# Patient Record
Sex: Female | Born: 1965 | Race: White | Hispanic: No | Marital: Married | Smoking: Never smoker
Health system: Southern US, Community
[De-identification: ages and names within clinical notes are randomized; demographics above are authoritative.]

## PROBLEM LIST (undated history)

## (undated) DIAGNOSIS — K219 Gastro-esophageal reflux disease without esophagitis: Secondary | ICD-10-CM

## (undated) DIAGNOSIS — R002 Palpitations: Secondary | ICD-10-CM

## (undated) DIAGNOSIS — N393 Stress incontinence (female) (male): Secondary | ICD-10-CM

## (undated) DIAGNOSIS — D649 Anemia, unspecified: Secondary | ICD-10-CM

## (undated) DIAGNOSIS — T7840XA Allergy, unspecified, initial encounter: Secondary | ICD-10-CM

## (undated) DIAGNOSIS — R112 Nausea with vomiting, unspecified: Secondary | ICD-10-CM

## (undated) DIAGNOSIS — R519 Headache, unspecified: Secondary | ICD-10-CM

## (undated) DIAGNOSIS — Z9889 Other specified postprocedural states: Secondary | ICD-10-CM

## (undated) DIAGNOSIS — R031 Nonspecific low blood-pressure reading: Secondary | ICD-10-CM

## (undated) DIAGNOSIS — T4145XA Adverse effect of unspecified anesthetic, initial encounter: Secondary | ICD-10-CM

## (undated) DIAGNOSIS — T8859XA Other complications of anesthesia, initial encounter: Secondary | ICD-10-CM

## (undated) DIAGNOSIS — R51 Headache: Secondary | ICD-10-CM

## (undated) HISTORY — PX: ABDOMINAL HYSTERECTOMY: SHX81

## (undated) HISTORY — DX: Stress incontinence (female) (male): N39.3

## (undated) HISTORY — PX: TONSILLECTOMY: SUR1361

## (undated) HISTORY — PX: PLANTAR FASCIA SURGERY: SHX746

## (undated) HISTORY — DX: Allergy, unspecified, initial encounter: T78.40XA

## (undated) HISTORY — PX: BREAST EXCISIONAL BIOPSY: SUR124

---

## 2000-05-08 ENCOUNTER — Encounter: Payer: Self-pay | Admitting: Emergency Medicine

## 2000-05-08 ENCOUNTER — Emergency Department (HOSPITAL_COMMUNITY): Admission: EM | Admit: 2000-05-08 | Discharge: 2000-05-08 | Payer: Self-pay | Admitting: Emergency Medicine

## 2000-05-12 HISTORY — PX: MRI: SHX5353

## 2005-02-03 ENCOUNTER — Ambulatory Visit: Payer: Self-pay | Admitting: Family Medicine

## 2005-02-08 ENCOUNTER — Ambulatory Visit: Payer: Self-pay | Admitting: Family Medicine

## 2005-02-09 ENCOUNTER — Encounter: Admission: RE | Admit: 2005-02-09 | Discharge: 2005-02-09 | Payer: Self-pay | Admitting: Family Medicine

## 2006-01-18 ENCOUNTER — Ambulatory Visit: Payer: Self-pay | Admitting: Family Medicine

## 2007-02-07 ENCOUNTER — Ambulatory Visit: Payer: Self-pay | Admitting: Family Medicine

## 2007-02-07 LAB — CONVERTED CEMR LAB
Basophils Absolute: 0 10*3/uL (ref 0.0–0.1)
Basophils Relative: 0 % (ref 0.0–1.0)
Eosinophils Absolute: 0.1 10*3/uL (ref 0.0–0.6)
Eosinophils Relative: 0.7 % (ref 0.0–5.0)
HCT: 38.9 % (ref 36.0–46.0)
Hemoglobin: 13.5 g/dL (ref 12.0–15.0)
Lymphocytes Relative: 4.8 % — ABNORMAL LOW (ref 12.0–46.0)
MCHC: 34.8 g/dL (ref 30.0–36.0)
MCV: 97.9 fL (ref 78.0–100.0)
Monocytes Absolute: 0.4 10*3/uL (ref 0.2–0.7)
Monocytes Relative: 4.8 % (ref 3.0–11.0)
Neutro Abs: 8.2 10*3/uL — ABNORMAL HIGH (ref 1.4–7.7)
Neutrophils Relative %: 89.7 % — ABNORMAL HIGH (ref 43.0–77.0)
Platelets: 305 10*3/uL (ref 150–400)
RBC: 3.97 M/uL (ref 3.87–5.11)
RDW: 12.9 % (ref 11.5–14.6)
WBC: 9.1 10*3/uL (ref 4.5–10.5)

## 2007-08-16 DIAGNOSIS — R002 Palpitations: Secondary | ICD-10-CM | POA: Insufficient documentation

## 2007-08-18 ENCOUNTER — Ambulatory Visit: Payer: Self-pay | Admitting: Family Medicine

## 2007-08-18 LAB — CONVERTED CEMR LAB
Bacteria, UA: 0
Beta hcg, urine, semiquantitative: NEGATIVE
Bilirubin Urine: NEGATIVE
Blood in Urine, dipstick: NEGATIVE
Casts: 0 /lpf
Epithelial cells, urine: 1 /lpf
Glucose, Urine, Semiquant: NEGATIVE
Ketones, urine, test strip: NEGATIVE
Nitrite: NEGATIVE
Protein, U semiquant: NEGATIVE
RBC / HPF: 0
Specific Gravity, Urine: 1.015
Urine crystals, microscopic: 0 /hpf
Urobilinogen, UA: 0.2
WBC Urine, dipstick: NEGATIVE
WBC, UA: 0 cells/hpf
Yeast, UA: 0
pH: 6

## 2007-08-20 ENCOUNTER — Encounter: Payer: Self-pay | Admitting: Family Medicine

## 2007-08-20 ENCOUNTER — Emergency Department: Payer: Self-pay | Admitting: Emergency Medicine

## 2007-08-21 ENCOUNTER — Telehealth: Payer: Self-pay | Admitting: Family Medicine

## 2007-08-23 ENCOUNTER — Encounter: Payer: Self-pay | Admitting: Family Medicine

## 2007-08-29 ENCOUNTER — Telehealth: Payer: Self-pay | Admitting: Family Medicine

## 2007-09-05 ENCOUNTER — Ambulatory Visit: Payer: Self-pay | Admitting: Family Medicine

## 2007-09-05 ENCOUNTER — Encounter: Payer: Self-pay | Admitting: Family Medicine

## 2007-09-12 ENCOUNTER — Encounter (INDEPENDENT_AMBULATORY_CARE_PROVIDER_SITE_OTHER): Payer: Self-pay | Admitting: *Deleted

## 2007-12-15 ENCOUNTER — Ambulatory Visit: Payer: Self-pay | Admitting: Family Medicine

## 2007-12-15 DIAGNOSIS — J019 Acute sinusitis, unspecified: Secondary | ICD-10-CM

## 2008-01-08 ENCOUNTER — Telehealth: Payer: Self-pay | Admitting: Family Medicine

## 2008-06-27 ENCOUNTER — Inpatient Hospital Stay: Payer: Self-pay | Admitting: Unknown Physician Specialty

## 2008-09-25 ENCOUNTER — Telehealth: Payer: Self-pay | Admitting: Family Medicine

## 2008-10-07 ENCOUNTER — Ambulatory Visit: Payer: Self-pay | Admitting: Family Medicine

## 2008-10-10 ENCOUNTER — Telehealth: Payer: Self-pay | Admitting: Family Medicine

## 2008-10-15 ENCOUNTER — Telehealth: Payer: Self-pay | Admitting: Family Medicine

## 2008-11-14 ENCOUNTER — Encounter: Payer: Self-pay | Admitting: Family Medicine

## 2008-11-14 ENCOUNTER — Ambulatory Visit: Payer: Self-pay | Admitting: Family Medicine

## 2008-11-19 ENCOUNTER — Encounter (INDEPENDENT_AMBULATORY_CARE_PROVIDER_SITE_OTHER): Payer: Self-pay | Admitting: *Deleted

## 2009-02-04 ENCOUNTER — Ambulatory Visit: Payer: Self-pay | Admitting: Family Medicine

## 2009-02-04 DIAGNOSIS — R5381 Other malaise: Secondary | ICD-10-CM

## 2009-02-04 DIAGNOSIS — R5383 Other fatigue: Secondary | ICD-10-CM | POA: Insufficient documentation

## 2009-02-05 LAB — CONVERTED CEMR LAB
BUN: 12 mg/dL (ref 6–23)
Basophils Absolute: 0 10*3/uL (ref 0.0–0.1)
Basophils Relative: 0.1 % (ref 0.0–3.0)
CO2: 32 meq/L (ref 19–32)
Calcium: 9.2 mg/dL (ref 8.4–10.5)
Chloride: 103 meq/L (ref 96–112)
Creatinine, Ser: 0.6 mg/dL (ref 0.4–1.2)
Eosinophils Absolute: 0.4 10*3/uL (ref 0.0–0.7)
Eosinophils Relative: 6 % — ABNORMAL HIGH (ref 0.0–5.0)
Folate: 12.6 ng/mL
GFR calc Af Amer: 141 mL/min
GFR calc non Af Amer: 117 mL/min
Glucose, Bld: 77 mg/dL (ref 70–99)
HCT: 37.6 % (ref 36.0–46.0)
Hemoglobin: 12.8 g/dL (ref 12.0–15.0)
Lymphocytes Relative: 21.7 % (ref 12.0–46.0)
MCHC: 34.1 g/dL (ref 30.0–36.0)
MCV: 99.9 fL (ref 78.0–100.0)
Monocytes Absolute: 0.6 10*3/uL (ref 0.1–1.0)
Monocytes Relative: 8.2 % (ref 3.0–12.0)
Neutro Abs: 4.9 10*3/uL (ref 1.4–7.7)
Neutrophils Relative %: 64 % (ref 43.0–77.0)
Platelets: 265 10*3/uL (ref 150–400)
Potassium: 4.2 meq/L (ref 3.5–5.1)
RBC: 3.77 M/uL — ABNORMAL LOW (ref 3.87–5.11)
RDW: 13 % (ref 11.5–14.6)
Sodium: 141 meq/L (ref 135–145)
TSH: 0.56 microintl units/mL (ref 0.35–5.50)
Vitamin B-12: 557 pg/mL (ref 211–911)
WBC: 7.5 10*3/uL (ref 4.5–10.5)

## 2009-02-06 ENCOUNTER — Ambulatory Visit: Payer: Self-pay | Admitting: Cardiovascular Disease

## 2009-02-14 ENCOUNTER — Telehealth: Payer: Self-pay | Admitting: Family Medicine

## 2009-08-13 ENCOUNTER — Ambulatory Visit: Payer: Self-pay | Admitting: Family Medicine

## 2009-08-14 ENCOUNTER — Telehealth: Payer: Self-pay | Admitting: Family Medicine

## 2009-08-21 ENCOUNTER — Telehealth: Payer: Self-pay | Admitting: Family Medicine

## 2009-09-02 ENCOUNTER — Telehealth: Payer: Self-pay | Admitting: Family Medicine

## 2009-11-24 ENCOUNTER — Ambulatory Visit: Payer: Self-pay | Admitting: Family Medicine

## 2009-11-24 ENCOUNTER — Encounter: Payer: Self-pay | Admitting: Family Medicine

## 2009-12-01 ENCOUNTER — Encounter (INDEPENDENT_AMBULATORY_CARE_PROVIDER_SITE_OTHER): Payer: Self-pay | Admitting: *Deleted

## 2009-12-18 ENCOUNTER — Ambulatory Visit: Payer: Self-pay | Admitting: Family Medicine

## 2009-12-18 DIAGNOSIS — M542 Cervicalgia: Secondary | ICD-10-CM

## 2009-12-18 DIAGNOSIS — R110 Nausea: Secondary | ICD-10-CM

## 2009-12-18 DIAGNOSIS — S0990XA Unspecified injury of head, initial encounter: Secondary | ICD-10-CM | POA: Insufficient documentation

## 2009-12-18 DIAGNOSIS — G4452 New daily persistent headache (NDPH): Secondary | ICD-10-CM

## 2009-12-22 ENCOUNTER — Telehealth: Payer: Self-pay | Admitting: Family Medicine

## 2009-12-23 ENCOUNTER — Telehealth: Payer: Self-pay | Admitting: Family Medicine

## 2010-07-02 ENCOUNTER — Emergency Department: Payer: Self-pay | Admitting: Emergency Medicine

## 2010-07-11 ENCOUNTER — Ambulatory Visit: Payer: Self-pay | Admitting: Orthopedic Surgery

## 2010-11-17 ENCOUNTER — Ambulatory Visit: Payer: Self-pay | Admitting: Family Medicine

## 2010-12-23 ENCOUNTER — Telehealth: Payer: Self-pay | Admitting: Family Medicine

## 2010-12-27 ENCOUNTER — Encounter: Payer: Self-pay | Admitting: Family Medicine

## 2011-01-05 NOTE — Assessment & Plan Note (Signed)
Summary: FELL HIT FOREHEAD/PAIN IN BACK OF NECK/RBH   Vital Signs:  Patient profile:   45 year old female Weight:      224 pounds Temp:     98.1 degrees F oral Pulse rate:   72 / minute Pulse rhythm:   regular BP sitting:   94 / 60  (left arm) Cuff size:   regular  Vitals Entered By: Lowella Petties CMA (December 18, 2009 1:19 PM)  CC: Larey Seat this morning, has pain left face and neck area.   History of Present Illness: this am - lost balance bending over - fell and hit her head on the floor  hit right in the middle of her forehead -- did no loose consciousness  felt weird for a while  her neck was sore right afterwards   had some nausea this am -- that is improved now (this is common anyway however)  has some headache - and neck hurts  headache is on L side of face and head  bump on her head is sore  no vision change  felt a little dizzy initially - but not now   was a little numb and tingly in L shoulder briefly- and this is resolved   Allergies: 1)  ! * Robitussin Ac 2)  * Toprol 3)  Ibuprofen  Past History:  Past Medical History: Last updated: 02/04/2009 allergies  endometriosis stress incontinence   Past Surgical History: Last updated: 02/04/2009 Holter monitor (12/11/1999) MRI brain- neg  (05/12/2000) hysterectomy total (7/09)- for endometriosis  Family History: Last updated: 08/16/2007 Father:  Mother: PE from DVT in leg Siblings: had 2 sisters, 1 died from breast cancer. 1 brother  Social History: Last updated: 08/16/2007 Marital Status: Married Children: 2 Occupation: Geneticist, molecular system  Risk Factors: Smoking Status: never (08/16/2007)  Review of Systems General:  Denies fatigue, loss of appetite, and malaise. Eyes:  Denies blurring, double vision, eye pain, and light sensitivity. ENT:  Denies earache. CV:  Denies chest pain or discomfort and palpitations. Resp:  Denies cough and shortness of breath. GI:  Complains of nausea; denies  vomiting. MS:  Complains of mid back pain and stiffness; denies joint pain. Derm:  Denies rash. Neuro:  Complains of headaches; denies inability to speak, memory loss, numbness, sensation of room spinning, tingling, visual disturbances, and weakness. Psych:  Denies anxiety and depression. Heme:  Denies abnormal bruising and bleeding.  Physical Exam  General:  overweight but generally well appearing   Head:  normocephalic.  bump with hematoma that is tender noted in middle of forehead  Eyes:  vision grossly intact, pupils equal, pupils round, and pupils reactive to light.  fundi grossly wnl Ears:  R ear normal and L ear normal.  no hemotympanum Nose:  no nasal discharge.   Mouth:  pharynx pink and moist.   Neck:  tender lower CS some pain to fully flex/ with nl ext and rotation   Chest Wall:  No deformities, masses, or tenderness noted. Lungs:  Normal respiratory effort, chest expands symmetrically. Lungs are clear to auscultation, no crackles or wheezes. Heart:  Normal rate and regular rhythm. S1 and S2 normal without gallop, murmur, click, rub or other extra sounds. Msk:  No deformity or scoliosis noted of thoracic or lumbar spine.   nl rom UEs bilat  no shoulder or trap tenderness noted Extremities:  No clubbing, cyanosis, edema, or deformity noted with normal full range of motion of all joints.   Neurologic:  alert & oriented X3,  cranial nerves II-XII intact, strength normal in all extremities, sensation intact to light touch, gait normal, DTRs symmetrical and normal, finger-to-nose normal, toes down bilaterally on Babinski, and Romberg negative.   Skin:  Intact without suspicious lesions or rashes Cervical Nodes:  No lymphadenopathy noted Psych:  normal affect, talkative and pleasant -- is a little anxious today   Impression & Recommendations:  Problem # 1:  HEAD TRAUMA, CLOSED (ICD-959.01) Assessment New with headache and nausea now  ref for Ct of head no contrast adv pt to  call asap if worse ha or other neurol sympotms  recommended just tylenol for headache for now  Orders: Radiology Referral (Radiology)  Problem # 2:  NECK PAIN (ICD-723.1) after head injury/ neck compression this am - without numbness/ weakness or UE symptoms  some lower CS tenderness  sent for CS film to update  adv is ok to use ice and heat for now  Orders: Radiology Referral (Radiology)  Complete Medication List: 1)  Womens Multivitamin Plus Tabs (Multiple vitamins-minerals) .... Take 1 tablet by mouth once a day 2)  Flonase 50 Mcg/act Susp (Fluticasone propionate) .... 2 sprays in each nostril once daily 3)  Proair Hfa 108 (90 Base) Mcg/act Aers (Albuterol sulfate) .... 2 puffs up to every 4 hours as needed wheezing 4)  Loratadine 10 Mg Tabs (Loratadine) .... Take one by mouth daily  Other Orders: Flu Vaccine 38yrs + [phone]) Admin 1st Vaccine (95621) Admin 1st Vaccine [facility]) [phone])  Patient Instructions: 1)  avoid aspirin or ibuprofen or aleve  2)  tylenol as needed for headache  3)  update me asap if headache or neck pain worsen or if any dizziness/ vomiting or vision change  4)  we will refer you for x ray of neck and CT of head at check out  5)  I will update you when I get a result   Prior Medications (reviewed today): WOMENS MULTIVITAMIN PLUS  TABS (MULTIPLE VITAMINS-MINERALS) Take 1 tablet by mouth once a day FLONASE 50 MCG/ACT SUSP (FLUTICASONE PROPIONATE) 2 sprays in each nostril once daily PROAIR HFA 108 (90 BASE) MCG/ACT AERS (ALBUTEROL SULFATE) 2 puffs up to every 4 hours as needed wheezing LORATADINE 10 MG TABS (LORATADINE) take one by mouth daily Current Allergies: ! * ROBITUSSIN AC * TOPROL IBUPROFEN   Influenza Vaccine    Vaccine Type: Fluvax 3+    Site: left deltoid    Mfr: GlaxoSmithKline    Dose: 0.5 ml    Route: IM    Given by: Lowella Petties CMA    Exp. Date: 06/04/2010    Lot #: [id number]    VIS given: 06/29/07 version given December 18, 2009.  Flu Vaccine Consent Questions    Do you have a history of severe allergic reactions to this vaccine? no    Any prior history of allergic reactions to egg and/or gelatin? no    Do you have a sensitivity to the preservative Thimersol? no    Do you have a past history of Guillan-Barre Syndrome? no    Do you currently have an acute febrile illness? no    Have you ever had a severe reaction to latex? no    Vaccine information given and explained to patient? yes    Are you currently pregnant? no

## 2011-01-05 NOTE — Progress Notes (Signed)
Summary: regarding CT  Phone Note Outgoing Call Call back at [facility] Phone [phone]   Summary of Call: Advised pt of xray results.  She is still having headaches and earaches.  She doesnt have a CT scheduled but would like to have this done at [facility].  Please advise. Initial call taken by: Lowella Petties CMA,  December 23, 2009 12:03 PM  Follow-up for Phone Call        I will go ahead and re - refer -- do not know why first one was canceled ? will route to [facility] Follow-up by: Judith Part MD,  December 23, 2009 12:23 PM  Additional Follow-up for Phone Call Additional follow up Details #1::        Pt said it was too far for her to drive and she wanted to get it done at [facility]. ok- thanks for the update  Additional Follow-up by: Lowella Petties CMA,  December 23, 2009 12:38 PM    Additional Follow-up for Phone Call Additional follow up Details #2::    REscheduled the CT Head at [facility] for 12/26/2009 at 8:00am. Order faxed to the registration desk at [facility].  Follow-up by: Carlton Adam,  December 23, 2009 3:14 PM   Appended Document: regarding CT Received a call from Carolyn/CT scanner at [facility] [phone], advising Dr. Milinda Antis that patient did not show for her CT scan today.    Appended Document: regarding CT please check in with her since she did not get CT- ask her how her symptoms are please   Appended Document: regarding CT [facility] asking pt to call.  Appended Document: regarding CT 3 messages have been left for pt to call back.

## 2011-01-05 NOTE — Progress Notes (Signed)
Summary: pt cancelled CT appt  Phone Note From Other Clinic   Caller: Receptionist Details for Reason: Cancelled CT Scan Summary of Call: Patient was scheduled to have a CT Scan today at [facility]. She called them today and cancelled the apt telling them she was having it set up somewhere else. It was authorized to be done with [place] she did not tell them where she was going. Initial call taken by: Carlton Adam,  December 22, 2009 2:46 PM  Follow-up for Phone Call        thanks- please check in with pt later and see how she is - ask where her CT was so I can send for it -- thanks  Follow-up by: Judith Part MD,  December 22, 2009 3:14 PM  Additional Follow-up for Phone Call Additional follow up Details #1::        [facility] for pt to call.               Lowella Petties CMA  December 23, 2009 8:42 AM  See other phone note.             Lowella Petties CMA  December 23, 2009 12:12 PM

## 2011-01-07 ENCOUNTER — Telehealth: Payer: Self-pay | Admitting: Family Medicine

## 2011-01-07 NOTE — Letter (Signed)
Summary: Out of Work  [facility] at [facility]  [address] [place], [place] [address]   Phone: [phone]  Fax: [phone]    November 17, 2010   Employee:  VERENA SHAWGO    To Whom It May Concern:   For Medical reasons, please excuse the above named employee from work/school from today until cough resolved.  Potentially contagious.   If you need additional information, please feel free to contact our office.         Sincerely,    Crawford Givens MD

## 2011-01-07 NOTE — Progress Notes (Signed)
Summary: sinus infection  Phone Note Call from Patient Call back at Home Phone [phone] Call back at Work Phone [phone]   Caller: Patient Call For: Briana Burnett Summary of Call: Patient was seen by Dr. Para March on 12-13 with a sinus infection. She took all of her antibiotic, but symptoms never went completely away. She says that she is having surgery on her foot friday and has already had to put it off because she was sick the first time. She says that she is still having cough, sputum, face feels tight. She is asking if she can get antibiotic called to [facility] [address]  Initial call taken by: Melody Comas,  December 23, 2010 9:39 AM  Follow-up for Phone Call        She needs recheck with an Burnett.  It's been over a month since last OV.  Follow-up by: Crawford Givens Burnett,  December 23, 2010 12:22 PM  Additional Follow-up for Phone Call Additional follow up Details #1::        Advised pt, she did not agree with decision.  I offered her appt for tomorrow but she said she couldnt come in until after 3:30.  I suggested she go to urgent care tonight to get started on medicine sooner.  She again asked if abx could be called in for her and I again told her it had been too long, she would need to be seen.  She thanked me and hung up the phone. Additional Follow-up by: Lowella Petties CMA, AAMA,  December 23, 2010 4:32 PM    Additional Follow-up for Phone Call Additional follow up Details #2::    noted. Crawford Givens Burnett  December 23, 2010 5:19 PM

## 2011-01-07 NOTE — Assessment & Plan Note (Signed)
Summary: NECK,BACK PAIN,FEVER/CLE   Vital Signs:  Patient profile:   45 year old female Height:      64.75 inches Weight:      224 pounds BMI:     37.70 Temp:     99.4 degrees F oral Pulse rate:   88 / minute Pulse rhythm:   regular BP sitting:   112 / 76  (left arm) Cuff size:   large  Vitals Entered By: Delilah Shan CMA Duncan Dull) (November 17, 2010 11:50 AM) CC: Neck, back pain, fever.   History of Present Illness: "Sinus symptoms going on for about 2 weeks."  Inc in aching and cold chills yesterday.  Plan for L plantar fasciitis surgery tomorrow, that has been postponed.  B ear pain. 99.8 last night.  Occ cough, pain with a deep breath, cough.  No sputum.  Sick contacts.  cough better with the SABA.    Allergies: 1)  ! * Robitussin Ac 2)  * Toprol 3)  Ibuprofen  Review of Systems       See HPI.  Otherwise negative.    Physical Exam  General:  GEN: nad, alert and oriented HEENT: mucous membranes moist, TM w/o erythema, nasal epithelium injected, OP with cobblestoning NECK: supple w/o LA, normal range of motion for neck CV: rrr. PULM: ctab, no inc wob, chest wall tender to palpation over sternum, less pain with deep breath is chest is compressed (as expected) ABD: soft, +bs EXT: no edema  L foot in ortho boot   Impression & Recommendations:  Problem # 1:  SINUSITIS - ACUTE-NOS (ICD-461.9) She has bilateral max sinus pain.  I would tx given the exam and duration.  Likely MSK chest wall pain. Nontoxic and no other work up needed.  I think it is prudent to delay the surgery, but I will defer formal decision to ortho.  Supportive tx o/w.  The following medications were removed from the medication list:    Flonase 50 Mcg/act Susp (Fluticasone propionate) .Marland Kitchen... 2 sprays in each nostril once daily Her updated medication list for this problem includes:    Amoxicillin 875 Mg Tabs (Amoxicillin) .Marland Kitchen... 1 by mouth two times a day  Complete Medication List: 1)  Proair Hfa 108  (90 Base) Mcg/act Aers (Albuterol sulfate) .... 2 puffs up to every 4 hours as needed wheezing 2)  Omeprazole 10 Mg Cpdr (Omeprazole) .... Once daily as needed 3)  Amoxicillin 875 Mg Tabs (Amoxicillin) .Marland Kitchen.. 1 by mouth two times a day  Patient Instructions: 1)  [facility] [address],[place] 2)  Start the antibiotics today and use the inhaler as needed.  3)  Get plenty of rest, drink lots of clear liquids, and use Tylenol for fever and comfort. Take care.  Prescriptions: PROAIR HFA 108 (90 BASE) MCG/ACT AERS (ALBUTEROL SULFATE) 2 puffs up to every 4 hours as needed wheezing  #1 mdi x 1   Entered and Authorized by:   Crawford Givens MD   Signed by:   Crawford Givens MD on 11/17/2010   Method used:   Faxed to ...       [facility] (retail)       [address]       [place], [place]    [place]       Ph: [phone]       Fax: [phone]   RxID:   [id number] AMOXICILLIN 875 MG TABS (AMOXICILLIN) 1 by mouth two times a day  #20 x 0  Entered and Authorized by:   Crawford Givens MD   Signed by:   Crawford Givens MD on 11/17/2010   Method used:   Faxed to ...       [facility] (retail)       [address]       [place], [place]    [place]       Ph: [phone]       Fax: [phone]   RxID:   [id number]    Orders Added: 1)  Est. Patient Level III [07371]    Current Allergies (reviewed today): ! * ROBITUSSIN AC * TOPROL IBUPROFEN

## 2011-01-08 ENCOUNTER — Encounter: Payer: Self-pay | Admitting: Family Medicine

## 2011-01-08 ENCOUNTER — Ambulatory Visit: Payer: Self-pay | Admitting: Family Medicine

## 2011-01-12 ENCOUNTER — Encounter (INDEPENDENT_AMBULATORY_CARE_PROVIDER_SITE_OTHER): Payer: Self-pay | Admitting: *Deleted

## 2011-01-12 LAB — HM MAMMOGRAPHY

## 2011-01-13 NOTE — Progress Notes (Signed)
Summary: needs order for mammogram  Phone Note Call from Patient Call back at Home Phone [phone]   Caller: Patient Call For: Judith Part MD Summary of Call: Patient is due for her yearly mammogram. She is asking for an order to be faxed to [facility]. She will schedule her own appt.  Initial call taken by: Melody Comas,  January 07, 2011 9:22 AM  Follow-up for Phone Call        Pt called back, she has appt tomorrow at 9:15, please send order today.                 Lowella Petties CMA, AAMA  January 07, 2011 9:36 AM  order done for [facility]  Follow-up by: Judith Part MD,  January 07, 2011 9:40 AM

## 2011-01-21 NOTE — Letter (Signed)
Summary: Results Follow up Letter  [place] at [facility]  [address] [place], [place] [address]   Phone: [phone]  Fax: [phone]    01/12/2011 MRN: [id number]  [name] [address] [place], [place]  [address]  Dear Ms. Canizales,  The following are the results of your recent test(s):  Test         Result    Pap Smear:        Normal _____  Not Normal _____ Comments: ______________________________________________________ Cholesterol: LDL(Bad cholesterol):         Your goal is less than:         HDL (Good cholesterol):       Your goal is more than: Comments:  ______________________________________________________ Mammogram:        Normal __X___  Not Normal _____ Comments: Repeat in 1 year.  ___________________________________________________________________ Hemoccult:        Normal _____  Not normal _______ Comments:    _____________________________________________________________________ Other Tests:    We routinely do not discuss normal results over the telephone.  If you desire a copy of the results, or you have any questions about this information we can discuss them at your next office visit.   Sincerely,      Sharilyn Sites for Dr. Roxy Manns

## 2011-01-21 NOTE — Miscellaneous (Signed)
Summary: Mammogram to flowsheet   Clinical Lists Changes  Observations: Added new observation of MAMMO DUE: 01/2012 (01/12/2011 9:40) Added new observation of MAMMOGRAM: normal (01/08/2011 9:40)      Preventive Care Screening  Mammogram:    Date:  01/08/2011    Next Due:  01/2012    Results:  normal

## 2011-02-09 ENCOUNTER — Encounter: Payer: Self-pay | Admitting: Family Medicine

## 2011-02-09 ENCOUNTER — Ambulatory Visit (INDEPENDENT_AMBULATORY_CARE_PROVIDER_SITE_OTHER): Payer: Self-pay | Admitting: Family Medicine

## 2011-02-10 ENCOUNTER — Ambulatory Visit: Payer: Self-pay | Admitting: Internal Medicine

## 2011-02-16 NOTE — Assessment & Plan Note (Signed)
Summary: uti/alc   Allergies: 1)  ! * Robitussin Ac 2)  * Toprol 3)  Ibuprofen   Complete Medication List: 1)  Proair Hfa 108 (90 Base) Mcg/act Aers (Albuterol sulfate) .... 2 puffs up to every 4 hours as needed wheezing 2)  Omeprazole 10 Mg Cpdr (Omeprazole) .... Once daily as needed 3)  Amoxicillin 875 Mg Tabs (Amoxicillin) .Marland Kitchen.. 1 by mouth two times a day  Other Orders: No Show NS50 (NS50)   Orders Added: 1)  No Show NS50 [NS50]

## 2011-04-23 NOTE — Assessment & Plan Note (Signed)
[facility]                                 ON-CALL NOTE   NAME:Briana Burnett, Briana Burnett                       MRN:          [id number]  DATE:08/20/2007                            DOB:          [birth year]    PHONE NUMBER:  [phone]   The patient has had some lower pelvic pain and Dr. Milinda Antis had told her if  it got worse to go to the emergency room because she probably needs an  ultrasound.  However, she called and wonders if she goes to the  emergency room will they really do an ultrasound or just give her  something for pain.  She states over the last day her pain has  increased, may radiate down her leg and it is difficult for her to walk.  She has no associated fever or vomiting.  I have recommended that she be  evaluated in the emergency room, but no guarantees of an ultrasound,  although, a CT scan may be more appropriate in that setting.  But if she  is in that much pain she should be evaluated.     Neta Mends. Panosh, MD  Electronically Signed    WKP/MedQ  DD: 08/20/2007  DT: 08/20/2007  Job #: [id number]

## 2011-11-09 ENCOUNTER — Other Ambulatory Visit: Payer: Self-pay

## 2011-11-16 ENCOUNTER — Encounter: Payer: Self-pay | Admitting: Family Medicine

## 2011-11-25 ENCOUNTER — Telehealth: Payer: Self-pay | Admitting: Family Medicine

## 2011-11-25 DIAGNOSIS — Z0000 Encounter for general adult medical examination without abnormal findings: Secondary | ICD-10-CM

## 2011-11-25 NOTE — Telephone Encounter (Signed)
Message copied by Judy Pimple on Thu Nov 25, 2011 10:21 PM ------      Message from: Alvina Chou      Created: Fri Nov 19, 2011 11:32 AM      Regarding: orders for 12-21       Patient is scheduled for CPX labs, please order future labs, Thanks , Briana Burnett

## 2011-11-26 ENCOUNTER — Other Ambulatory Visit: Payer: Self-pay | Admitting: Family Medicine

## 2011-11-26 ENCOUNTER — Other Ambulatory Visit: Payer: Self-pay

## 2011-11-26 DIAGNOSIS — Z0000 Encounter for general adult medical examination without abnormal findings: Secondary | ICD-10-CM

## 2011-12-03 ENCOUNTER — Encounter: Payer: Self-pay | Admitting: Family Medicine

## 2012-01-26 ENCOUNTER — Telehealth: Payer: Self-pay | Admitting: Family Medicine

## 2012-01-26 NOTE — Telephone Encounter (Signed)
Pt was calling because her CPE had been changed from March out to May. 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 She was wondering if this would be ok to move it back to march as long as it would be 30 mins allotted.

## 2012-01-26 NOTE — Telephone Encounter (Signed)
It should be fine to change to end of March as long as the day picked does not have multiple CPX's or 30 min appts already scheduled. Thank you.

## 2012-01-27 ENCOUNTER — Encounter: Payer: Self-pay | Admitting: Family Medicine

## 2012-01-28 ENCOUNTER — Ambulatory Visit: Payer: Self-pay | Admitting: Internal Medicine

## 2012-02-25 ENCOUNTER — Other Ambulatory Visit: Payer: Self-pay

## 2012-02-29 ENCOUNTER — Encounter: Payer: Self-pay | Admitting: Family Medicine

## 2012-03-03 ENCOUNTER — Other Ambulatory Visit: Payer: Self-pay

## 2012-03-06 ENCOUNTER — Other Ambulatory Visit (INDEPENDENT_AMBULATORY_CARE_PROVIDER_SITE_OTHER): Payer: BC Managed Care – PPO

## 2012-03-06 DIAGNOSIS — Z0000 Encounter for general adult medical examination without abnormal findings: Secondary | ICD-10-CM

## 2012-03-06 LAB — COMPREHENSIVE METABOLIC PANEL
Albumin: 4 g/dL (ref 3.5–5.2)
Alkaline Phosphatase: 96 U/L (ref 39–117)
Chloride: 105 mEq/L (ref 96–112)
Glucose, Bld: 99 mg/dL (ref 70–99)
Potassium: 3.8 mEq/L (ref 3.5–5.1)
Sodium: 144 mEq/L (ref 135–145)
Total Protein: 7.5 g/dL (ref 6.0–8.3)

## 2012-03-06 LAB — CBC WITH DIFFERENTIAL/PLATELET
Eosinophils Absolute: 0.1 10*3/uL (ref 0.0–0.7)
MCHC: 33 g/dL (ref 30.0–36.0)
MCV: 100.7 fl — ABNORMAL HIGH (ref 78.0–100.0)
Monocytes Absolute: 0.7 10*3/uL (ref 0.1–1.0)
Neutrophils Relative %: 75.8 % (ref 43.0–77.0)
Platelets: 257 10*3/uL (ref 150.0–400.0)
WBC: 8.3 10*3/uL (ref 4.5–10.5)

## 2012-03-06 LAB — TSH: TSH: 0.67 u[IU]/mL (ref 0.35–5.50)

## 2012-03-06 LAB — LIPID PANEL: VLDL: 12 mg/dL (ref 0.0–40.0)

## 2012-03-08 ENCOUNTER — Encounter: Payer: Self-pay | Admitting: Family Medicine

## 2012-03-08 DIAGNOSIS — Z0289 Encounter for other administrative examinations: Secondary | ICD-10-CM

## 2012-03-10 ENCOUNTER — Ambulatory Visit: Payer: Self-pay | Admitting: Family Medicine

## 2012-03-10 ENCOUNTER — Encounter: Payer: Self-pay | Admitting: Family Medicine

## 2012-03-10 LAB — HM MAMMOGRAPHY
HM Mammogram: NEGATIVE
HM Mammogram: NORMAL

## 2012-03-13 ENCOUNTER — Encounter: Payer: Self-pay | Admitting: Family Medicine

## 2012-03-13 ENCOUNTER — Ambulatory Visit (INDEPENDENT_AMBULATORY_CARE_PROVIDER_SITE_OTHER): Payer: BC Managed Care – PPO | Admitting: Family Medicine

## 2012-03-13 VITALS — BP 120/82 | HR 92 | Temp 98.3°F | Ht 64.75 in | Wt 226.5 lb

## 2012-03-13 DIAGNOSIS — H9190 Unspecified hearing loss, unspecified ear: Secondary | ICD-10-CM

## 2012-03-13 DIAGNOSIS — H9191 Unspecified hearing loss, right ear: Secondary | ICD-10-CM

## 2012-03-13 DIAGNOSIS — Z0000 Encounter for general adult medical examination without abnormal findings: Secondary | ICD-10-CM

## 2012-03-13 DIAGNOSIS — Z23 Encounter for immunization: Secondary | ICD-10-CM

## 2012-03-13 DIAGNOSIS — J309 Allergic rhinitis, unspecified: Secondary | ICD-10-CM

## 2012-03-13 MED ORDER — OMEPRAZOLE 20 MG PO CPDR: 20.0000 mg | DELAYED_RELEASE_CAPSULE | Freq: Every day | ORAL | Status: DC

## 2012-03-13 NOTE — Patient Instructions (Signed)
Check out an App called "my fitness pal" for wt loss guidance Try to exercise 5 days per week  Tdap today  Try allegra over the counter for allergies Continue flonase if it helps  We will do ENT referral at check out

## 2012-03-13 NOTE — Assessment & Plan Note (Signed)
Per pt - has had long term Interested in opt for treatment  Will ref to ENT

## 2012-03-13 NOTE — Assessment & Plan Note (Signed)
With rhinorrhea and sneezing  Finds zyrtec and claritin too sedating Adv to try allegra otc as directed  Also remain on flonase Update if not improved

## 2012-03-13 NOTE — Progress Notes (Signed)
Subjective:    Patient ID: [name], female    DOB: [birth year], 46 y.o.   MRN: [id number]  HPI Here for health maintenance exam and to review chronic medical problems   Has been feeling ok overall   Is concerned about her glucose  - tends to get hyperglycemic easily  Sugar 99 Also urinates times one at night   Wt is up 2 lb with bmi of 37 Most of her gain was after her hysterectomy  Not on hormones   Has some hearing loss - wants to get that checked out  R ear Some sort of bony problem in ear?  bp good 120/82 No problems with that Needs to get more exercise and loose wt   otc allergy med makes her sleepy  Has tried zyrtec - sleepy  claritin - sleepy  allegry- not tried  Has had flonase - using now     Had total hyst in past    mammo 4/13- normal  sself exam- no lumps or changes   Tdap -- ? When last one was ?  Did have all 3 hepatitis shots  Flu shot -got it in the fall   Patient Active Problem List  Diagnoses  . NECK PAIN  . FATIGUE  . HEADACHE  . PALPITATIONS  . NAUSEA  . HEAD TRAUMA, CLOSED  . Routine general medical examination at a health care facility  . Allergic rhinitis  . Hearing loss in right ear   Past Medical History  Diagnosis Date  . Allergy   . Endometriosis   . Stress incontinence    Past Surgical History  Procedure Date  . Abdominal hysterectomy   . Mri 05/12/2000    brain was neg   History  Substance Use Topics  . Smoking status: Never Smoker   . Smokeless tobacco: Never Used  . Alcohol Use: No   No family history on file. Allergies  Allergen Reactions  . Ibuprofen     REACTION: ? reaction   Current Outpatient Prescriptions on File Prior to Visit  Medication Sig Dispense Refill  . albuterol (PROVENTIL HFA;VENTOLIN HFA) 108 (90 BASE) MCG/ACT inhaler Inhale 2 puffs into the lungs every 4 (four) hours as needed.      . fluticasone (FLONASE) 50 MCG/ACT nasal spray Place 2 sprays into the nose daily.             Review of Systems Review of Systems  Constitutional: Negative for fever, appetite change, fatigue and unexpected weight change.  Eyes: Negative for pain and visual disturbance.  ENT pos for decreased hearing in R ear , pos for allergies- sneezing and runny nose  Respiratory: Negative for cough and shortness of breath Cardiovascular: Negative for cp or palpitations    Gastrointestinal: Negative for nausea, diarrhea and constipation.  Genitourinary: Negative for urgency and frequency.  Skin: Negative for pallor or rash   Neurological: Negative for weakness, light-headedness, numbness and headaches.  Hematological: Negative for adenopathy. Does not bruise/bleed easily.  Psychiatric/Behavioral: Negative for dysphoric mood. The patient is not nervous/anxious.          Objective:   Physical Exam  Constitutional: She appears well-developed and well-nourished. No distress.       Obese and well appearing    HENT:  Head: Normocephalic and atraumatic.  Right Ear: External ear normal.  Left Ear: External ear normal.  Nose: Nose normal.  Mouth/Throat: Oropharynx is clear and moist.       Grossly decreased hearing to finger  rub in R ear  Nares are boggy  Eyes: Conjunctivae and EOM are normal. Pupils are equal, round, and reactive to light. No scleral icterus.  Neck: Normal range of motion. Neck supple. No JVD present. Carotid bruit is not present. No thyromegaly present.  Cardiovascular: Normal rate, regular rhythm, normal heart sounds and intact distal pulses.  Exam reveals no gallop.   Pulmonary/Chest: Effort normal and breath sounds normal. No respiratory distress. She has no wheezes.  Abdominal: Soft. Bowel sounds are normal. She exhibits no distension and no abdominal bruit. There is no tenderness.  Genitourinary: No breast swelling, tenderness, discharge or bleeding.       Breast exam: No mass, nodules, thickening, tenderness, bulging, retraction, inflamation, nipple discharge  or skin changes noted.  No axillary or clavicular LA.  Chaperoned exam.    Musculoskeletal: Normal range of motion. She exhibits no edema and no tenderness.  Lymphadenopathy:    She has no cervical adenopathy.  Neurological: She is alert. She has normal reflexes. No cranial nerve deficit. She exhibits normal muscle tone. Coordination normal.  Skin: Skin is warm and dry. No rash noted. No erythema. No pallor.  Psychiatric: She has a normal mood and affect.          Assessment & Plan:

## 2012-03-13 NOTE — Assessment & Plan Note (Addendum)
Patient Active Problem List  Diagnoses  . NECK PAIN  . FATIGUE  . HEADACHE  . PALPITATIONS  . NAUSEA  . HEAD TRAUMA, CLOSED  . Routine general medical examination at a health care facility  . Allergic rhinitis  . Hearing loss in right ear   Past Medical History  Diagnosis Date  . Allergy   . Endometriosis   . Stress incontinence    Past Surgical History  Procedure Date  . Abdominal hysterectomy   . Mri 05/12/2000    brain was neg   No family history on file. Allergies  Allergen Reactions  . Ibuprofen     REACTION: ? reaction  Reviewed health habits including diet and exercise and skin cancer prev Also reviewed health mt list, fam hx and immunizations   Rev wellness labs in detail and need for wt loss

## 2012-03-14 ENCOUNTER — Encounter: Payer: Self-pay | Admitting: Family Medicine

## 2012-03-14 ENCOUNTER — Encounter: Payer: Self-pay | Admitting: *Deleted

## 2012-04-11 ENCOUNTER — Other Ambulatory Visit: Payer: Self-pay

## 2012-04-18 ENCOUNTER — Encounter: Payer: Self-pay | Admitting: Family Medicine

## 2012-12-20 ENCOUNTER — Ambulatory Visit (INDEPENDENT_AMBULATORY_CARE_PROVIDER_SITE_OTHER): Payer: BC Managed Care – PPO | Admitting: Family Medicine

## 2012-12-20 ENCOUNTER — Encounter: Payer: Self-pay | Admitting: Family Medicine

## 2012-12-20 VITALS — BP 114/62 | HR 84 | Temp 98.6°F | Ht 64.75 in | Wt 227.8 lb

## 2012-12-20 DIAGNOSIS — J019 Acute sinusitis, unspecified: Secondary | ICD-10-CM

## 2012-12-20 DIAGNOSIS — B9689 Other specified bacterial agents as the cause of diseases classified elsewhere: Secondary | ICD-10-CM

## 2012-12-20 MED ORDER — AMOXICILLIN-POT CLAVULANATE 875-125 MG PO TABS: 1.0000 | ORAL_TABLET | Freq: Two times a day (BID) | ORAL | Status: DC

## 2012-12-20 NOTE — Assessment & Plan Note (Signed)
With over 7 days of congestion/ facial pain/ fever tx with augmentin  Disc symptomatic care - see instructions on AVS  Update if not starting to improve in a week or if worsening

## 2012-12-20 NOTE — Patient Instructions (Addendum)
Take augmentin as directed for sinus infection  Drink lots of fluids  Try nasal saline spray  mucinex is ok for congestion  Update if not starting to improve in a week or if worsening

## 2012-12-20 NOTE — Progress Notes (Signed)
  Subjective:    Patient ID: Briana Burnett, female    DOB: [birth year], 47 y.o.   MRN: [id number]  HPI Has had uri symptoms for over a week and getting worse  Is sick to stomach from drainage Yellow green nasal drainage Facial pain  Few days 100.5 - achey   Cough- not a lot/ just a bit Ears hurt No st   Patient Active Problem List  Diagnosis  . FATIGUE  . HEADACHE  . PALPITATIONS  . HEAD TRAUMA, CLOSED  . Routine general medical examination at a health care facility  . Allergic rhinitis  . Hearing loss in right ear   Past Medical History  Diagnosis Date  . Allergy   . Endometriosis   . Stress incontinence    Past Surgical History  Procedure Date  . Abdominal hysterectomy   . Mri 05/12/2000    brain was neg   History  Substance Use Topics  . Smoking status: Never Smoker   . Smokeless tobacco: Never Used  . Alcohol Use: No   No family history on file. Allergies  Allergen Reactions  . Ibuprofen     REACTION: ? reaction   Current Outpatient Prescriptions on File Prior to Visit  Medication Sig Dispense Refill  . albuterol (PROVENTIL HFA;VENTOLIN HFA) 108 (90 BASE) MCG/ACT inhaler Inhale 2 puffs into the lungs every 4 (four) hours as needed.      . fluticasone (FLONASE) 50 MCG/ACT nasal spray Place 2 sprays into the nose as needed.       Marland Kitchen omeprazole (PRILOSEC) 20 MG capsule Take 1 capsule (20 mg total) by mouth daily.  30 capsule  11      Review of Systems Review of Systems  Constitutional: Negative for , appetite change, and unexpected weight change.  Eyes: Negative for pain and visual disturbance.  ENT pos for congestion/ sinus pain/post nasal drip  Respiratory: Negative for cough and shortness of breath.   Cardiovascular: Negative for cp or palpitations    Gastrointestinal: Negative for nausea, diarrhea and constipation.  Genitourinary: Negative for urgency and frequency.  Skin: Negative for pallor or rash   Neurological: Negative for weakness,  light-headedness, numbness and headaches.  Hematological: Negative for adenopathy. Does not bruise/bleed easily.  Psychiatric/Behavioral: Negative for dysphoric mood. The patient is not nervous/anxious.         Objective:   Physical Exam  Constitutional: She appears well-developed and well-nourished. No distress.  HENT:  Head: Normocephalic and atraumatic.  Right Ear: External ear normal.  Left Ear: External ear normal.  Mouth/Throat: Oropharynx is clear and moist. No oropharyngeal exudate.       Nares are injected and congested  bilat maxillary and frontal sinus tenderness  Eyes: Conjunctivae normal and EOM are normal. Pupils are equal, round, and reactive to light. Right eye exhibits no discharge. Left eye exhibits no discharge.  Neck: Normal range of motion. Neck supple.  Cardiovascular: Normal rate and regular rhythm.   Pulmonary/Chest: Effort normal and breath sounds normal. No respiratory distress. She has no wheezes.  Lymphadenopathy:    She has no cervical adenopathy.  Neurological: She is alert. She has normal reflexes. No cranial nerve deficit.  Skin: Skin is warm and dry. No rash noted.  Psychiatric: She has a normal mood and affect.          Assessment & Plan:.

## 2013-02-19 ENCOUNTER — Ambulatory Visit (INDEPENDENT_AMBULATORY_CARE_PROVIDER_SITE_OTHER): Payer: BC Managed Care – PPO | Admitting: Family Medicine

## 2013-02-19 ENCOUNTER — Encounter: Payer: Self-pay | Admitting: Family Medicine

## 2013-02-19 VITALS — BP 128/84 | HR 89 | Temp 98.7°F | Ht 64.75 in

## 2013-02-19 DIAGNOSIS — R599 Enlarged lymph nodes, unspecified: Secondary | ICD-10-CM | POA: Insufficient documentation

## 2013-02-19 NOTE — Patient Instructions (Addendum)
Checking complete blood count today If you develop cold symptoms or fever - let me know  Use a warm compress on the swollen area on neck We will call you with results and make a plan

## 2013-02-19 NOTE — Progress Notes (Signed)
Subjective:    Patient ID: Briana Burnett, female    DOB: 30-Oct-1966, 47 y.o.   MRN: [id number]  HPI Here with swollen glands  2 days ago - feels like she has a swollen / tender knot uncer R side of jaw (it hurts even when not touching it)  Some sinus pain and headaches  ? If sinus infection  No sore throat  No purulent nasal drainage or bleeding  A lot of pressure under her eyes  No dental issues on that side ---has a tooth on L that needs a new crown  Has felt a bit dizzy lately   A lot of cancer in family  Lymphoma in a distant relative  Sister had breast cancer at 29 and died at 48  M aunts times 2 with breast cancer  P cousin with breast cancer  (she keeps up with her mammograms)  Is interested in talking about addnl testing - ?3 D mammo or MRI?   Patient Active Problem List  Diagnosis  . FATIGUE  . HEADACHE  . PALPITATIONS  . HEAD TRAUMA, CLOSED  . Routine general medical examination at a health care facility  . Allergic rhinitis  . Hearing loss in right ear  . Acute bacterial sinusitis   Past Medical History  Diagnosis Date  . Allergy   . Endometriosis   . Stress incontinence    Past Surgical History  Procedure Laterality Date  . Abdominal hysterectomy    . Mri  05/12/2000    brain was neg   History  Substance Use Topics  . Smoking status: Never Smoker   . Smokeless tobacco: Never Used  . Alcohol Use: No   No family history on file. Allergies  Allergen Reactions  . Ibuprofen     Several doses causes blurred vision   Current Outpatient Prescriptions on File Prior to Visit  Medication Sig Dispense Refill  . albuterol (PROVENTIL HFA;VENTOLIN HFA) 108 (90 BASE) MCG/ACT inhaler Inhale 2 puffs into the lungs every 4 (four) hours as needed.      Marland Kitchen omeprazole (PRILOSEC) 20 MG capsule Take 1 capsule (20 mg total) by mouth daily.  30 capsule  11  . fluticasone (FLONASE) 50 MCG/ACT nasal spray Place 2 sprays into the nose as needed.        No current  facility-administered medications on file prior to visit.     Review of Systems Review of Systems  Constitutional: Negative for fever, appetite change, and unexpected weight change.  ENT pos for some sinus pressure, neg for pain or purulent nasal drainage Eyes: Negative for pain and visual disturbance.  Respiratory: Negative for cough and shortness of breath.   Cardiovascular: Negative for cp or palpitations    Gastrointestinal: Negative for nausea, diarrhea and constipation.  Genitourinary: Negative for urgency and frequency.  Skin: Negative for pallor or rash   Neurological: Negative for weakness, light-headedness, numbness and headaches.  Hematological: . Does not bruise/bleed easily.  Psychiatric/Behavioral: Negative for dysphoric mood. The patient is not nervous/anxious.         Objective:   Physical Exam  Constitutional: She appears well-developed and well-nourished. No distress.  HENT:  Head: Normocephalic and atraumatic.  Mouth/Throat: Oropharynx is clear and moist.  Eyes: Conjunctivae and EOM are normal. Pupils are equal, round, and reactive to light. Right eye exhibits no discharge. Left eye exhibits no discharge. No scleral icterus.  Neck: Normal range of motion. Neck supple. No JVD present. No tracheal deviation present. No thyromegaly present.  Area of fullness felt over anterior cervical area under mandible - 1-2 cm , tender No erythema  No other adenopathy  Cardiovascular: Normal rate and regular rhythm.   Pulmonary/Chest: Effort normal and breath sounds normal. No respiratory distress. She has no wheezes.  Abdominal: She exhibits no mass.  Musculoskeletal: She exhibits no edema.  Lymphadenopathy:    She has cervical adenopathy.  Neurological: She is alert.  Skin: Skin is warm and dry. No rash noted. No erythema. No pallor.  Psychiatric: She has a normal mood and affect.          Assessment & Plan:

## 2013-02-19 NOTE — Assessment & Plan Note (Signed)
Full area under R mandible - which is tender (about 1 cm) -no other troublesome areas  Some sinus fullness but no fever or other symptoms Check CBC with diff today  Adv to use heat on area  May ref to ENT  Depending on course and blood result

## 2013-02-20 ENCOUNTER — Telehealth: Payer: Self-pay

## 2013-02-20 LAB — CBC WITH DIFFERENTIAL/PLATELET
Basophils Absolute: 0.1 10*3/uL (ref 0.0–0.1)
Eosinophils Absolute: 0.1 10*3/uL (ref 0.0–0.7)
Hemoglobin: 11.8 g/dL — ABNORMAL LOW (ref 12.0–15.0)
Lymphocytes Relative: 15.7 % (ref 12.0–46.0)
MCHC: 33 g/dL (ref 30.0–36.0)
Monocytes Relative: 6.9 % (ref 3.0–12.0)
Neutrophils Relative %: 74.6 % (ref 43.0–77.0)
Platelets: 273 10*3/uL (ref 150.0–400.0)
RDW: 13.6 % (ref 11.5–14.6)

## 2013-02-20 NOTE — Telephone Encounter (Signed)
Please see lab- I made comment

## 2013-02-20 NOTE — Telephone Encounter (Signed)
Pt notified of lab results

## 2013-02-20 NOTE — Telephone Encounter (Signed)
Pt request call back with lab results from 02/19/13.Please advise.

## 2013-05-03 ENCOUNTER — Other Ambulatory Visit: Payer: Self-pay | Admitting: Family Medicine

## 2013-05-03 DIAGNOSIS — Z1231 Encounter for screening mammogram for malignant neoplasm of breast: Secondary | ICD-10-CM

## 2013-06-01 ENCOUNTER — Ambulatory Visit: Payer: BC Managed Care – PPO

## 2013-06-12 ENCOUNTER — Ambulatory Visit
Admission: RE | Admit: 2013-06-12 | Discharge: 2013-06-12 | Disposition: A | Discharge status: Home / Self Care | Payer: BC Managed Care – PPO | Source: Ambulatory Visit | Attending: Family Medicine | Admitting: Family Medicine

## 2013-06-12 DIAGNOSIS — Z1231 Encounter for screening mammogram for malignant neoplasm of breast: Secondary | ICD-10-CM

## 2013-06-15 ENCOUNTER — Encounter: Payer: Self-pay | Admitting: *Deleted

## 2013-07-24 ENCOUNTER — Encounter: Payer: Self-pay | Admitting: Internal Medicine

## 2013-07-24 ENCOUNTER — Telehealth: Payer: Self-pay | Admitting: Family Medicine

## 2013-07-24 ENCOUNTER — Ambulatory Visit (INDEPENDENT_AMBULATORY_CARE_PROVIDER_SITE_OTHER): Payer: BC Managed Care – PPO | Admitting: Internal Medicine

## 2013-07-24 VITALS — BP 118/76 | HR 75 | Temp 98.1°F | Resp 14 | Wt 230.5 lb

## 2013-07-24 DIAGNOSIS — R002 Palpitations: Secondary | ICD-10-CM

## 2013-07-24 DIAGNOSIS — Z8249 Family history of ischemic heart disease and other diseases of the circulatory system: Secondary | ICD-10-CM

## 2013-07-24 DIAGNOSIS — R350 Frequency of micturition: Secondary | ICD-10-CM

## 2013-07-24 DIAGNOSIS — R0609 Other forms of dyspnea: Secondary | ICD-10-CM

## 2013-07-24 DIAGNOSIS — G4710 Hypersomnia, unspecified: Secondary | ICD-10-CM

## 2013-07-24 DIAGNOSIS — R51 Headache: Secondary | ICD-10-CM

## 2013-07-24 DIAGNOSIS — R0683 Snoring: Secondary | ICD-10-CM

## 2013-07-24 DIAGNOSIS — E669 Obesity, unspecified: Secondary | ICD-10-CM

## 2013-07-24 DIAGNOSIS — R351 Nocturia: Secondary | ICD-10-CM | POA: Insufficient documentation

## 2013-07-24 LAB — CBC WITH DIFFERENTIAL/PLATELET
Basophils Absolute: 0 10*3/uL (ref 0.0–0.1)
Basophils Relative: 0 % (ref 0–1)
MCHC: 34 g/dL (ref 30.0–36.0)
Monocytes Absolute: 0.7 10*3/uL (ref 0.1–1.0)
Neutro Abs: 4.3 10*3/uL (ref 1.7–7.7)
Neutrophils Relative %: 65 % (ref 43–77)
RDW: 13.8 % (ref 11.5–15.5)

## 2013-07-24 LAB — POCT URINALYSIS DIPSTICK
Blood, UA: NEGATIVE
Ketones, UA: NEGATIVE
Leukocytes, UA: NEGATIVE
Protein, UA: NEGATIVE
pH, UA: 7

## 2013-07-24 MED ORDER — TRAMADOL HCL 50 MG PO TABS: 50.0000 mg | ORAL_TABLET | Freq: Four times a day (QID) | ORAL | Status: DC | PRN

## 2013-07-24 NOTE — Progress Notes (Signed)
Patient ID: Briana Burnett, female   DOB: August 07, 1966, 47 y.o.   MRN: [id number]  Patient Active Problem List   Diagnosis Date Noted  . Enlargement of lymph nodes 02/19/2013  . Allergic rhinitis 03/13/2012  . Hearing loss in right ear 03/13/2012  . Routine general medical examination at a health care facility 11/25/2011  . New daily persistent headache 12/18/2009  . HEAD TRAUMA, CLOSED 12/18/2009  . FATIGUE 02/04/2009  . PALPITATIONS 08/16/2007    Subjective:  CC:   Chief Complaint  Patient presents with  . Acute Visit    headache ,tachycardia  . Headache    Headache first then start feeling her heart race, reports some SOB  . Tachycardia  . Shortness of Breath    HPI:   Briana Burnett is a 47 y.o. female who presents as a new patient to establish primary care with the chief complaint of  Headaches accompanied by irregular pulse noted by daughter several days ago  and shortness of breath. She reports a 2 week history of headaches.  The headaches are occurring daily and always bitemporal. She states that she has been waking up with a mild headache  which progresses to a severe headache by mid  morning. They're relieved temporarily with ibuprofen 800 mg dose  but after a half hour they have returned to their previous severity.  she has avoided using NSAIDs on a a daily basis. she has experienced no changes in vision during these headaches.  She has not had an eye exam in several years. She's had no recent medication changes. She's had no recent travel to [place] areas. No history of tick bites or fevers. No joint pain.  No history of MVA or neck pain.  she's had headaches in the past but no history of migraines, cluster headaches, or prior headaches in the bitemporal region. There is no family history of vasculitis or autoimmune syndromes with is a strong family history of early heart disease. She has no history of hypertension. . Temples are both sore to the touch.   She notices that  her heart rate speeds up when the headaches become severe and then she gets short of breath, but she has also noticed shortness of breath with minimal exertion,  lying supine , and for the last month or so has become light headed when she laughs.   She has a history of snoring,  and has gained 100 pounds over the last 17 years, since the birth of her son. Most of the way occurred after her TAH/BSO 5 years ago for endometriosis. She has noted hypersomnolence and trouble staying awake on multiple occasions during the day.   She underwent evaluation for palpitations with a Holter monitor in 2000 which was apparently normal ([place])  .    Past Medical History  Diagnosis Date  . Allergy   . Endometriosis   . Stress incontinence     Past Surgical History  Procedure Laterality Date  . Abdominal hysterectomy    . Mri  05/12/2000    brain was neg    Family History  Problem Relation Age of Onset  . COPD Mother   . Pulmonary embolism Mother   . Cancer Sister 24    breast ca  . Heart disease Father   . Mental illness Father 85    dementia  . COPD Father   . Alcohol abuse Father   . Cancer Maternal Aunt     breast ca  . Heart disease  Maternal Uncle   . Heart disease Maternal Grandmother   . Cancer Maternal Aunt     breast ca    History   Social History  . Marital Status: Married    Spouse Name: N/A    Number of Children: 2  . Years of Education: N/A   Occupational History  . [employer]    Social History Main Topics  . Smoking status: Never Smoker   . Smokeless tobacco: Never Used  . Alcohol Use: No  . Drug Use: No  . Sexual Activity: Not on file   Other Topics Concern  . Not on file   Social History Narrative  . No narrative on file       @ALLHX @    Review of Systems:   The remainder of the review of systems was negative except those addressed in the HPI.       Objective:  BP 118/76  Pulse 75  Temp(Src) 98.1 F (36.7 C) (Oral)  Resp  14  Wt 230 lb 8 oz (104.554 kg)  BMI 38.64 kg/m2  SpO2 99%  General appearance: alert, cooperative and appears stated age Ears: normal TM's and external ear canals both ears Throat: lips, mucosa, and tongue normal; teeth and gums normal Neck: no adenopathy, no carotid bruit, supple, symmetrical, trachea midline and thyroid not enlarged, symmetric, no tenderness/mass/nodules Back: symmetric, no curvature. ROM normal. No CVA tenderness. Lungs: clear to auscultation bilaterally Heart: regular rate and rhythm, S1, S2 normal, no murmur, click, rub or gallop Abdomen: soft, non-tender; bowel sounds normal; no masses,  no organomegaly Pulses: 2+ and symmetric Skin: Skin color, texture, turgor normal. No rashes or lesions Lymph nodes: Cervical, supraclavicular, and axillary nodes normal.  Assessment and Plan:  PALPITATIONS Reportedly the daughter noticed an irregular pulse several days ago. Patient has not had any subjective feelings of palpitations as she did in 2000 when she underwent Holter monitor. EKG today is normal. She has no murmur on exam to suggest mitral valve prolapse. We'll check electrolytes and thyroid function and Will set up another Holter monitor by lower cardiology.  New daily persistent headache Need to rule out temporal arteritis given location and exam,  untreated sleep apnea given obesity, hypersomnolence, and snoring disorder; , eyestrain as she has not had a formal eye exam in several years. She has intentionally avoided using NSAIDs on a daily basis. Prescription for tramadol given pending further evaluation. Sleep study,  sedimentation rate, and C. reactive protein are pending. Patient is advised to get a formal eye exam as soon as possible. If her sedimentation rate or C-reactive protein is moderately elevated we will refer for temporal artery biopsy.  Obesity, unspecified I have addressed  BMI and recommended a low glycemic index diet utilizing smaller more frequent  meals to increase metabolism.  I have also recommended that patient start exercising with a goal of 30 minutes of aerobic exercise a minimum of 5 days per week. Screening for thyroid and diabetes to be done today.    Nocturia Patient states that for several months she has been urinating 3-4  times per night. She Is also having frequent episodes of diaphoresis and shakiness when she fast for longer than 3 hours. i she is obese, has a high carbohydrate diet drinks sweet tea all day long.  I suspect that she may have diabetes,  but she has no glucosuria by dipstick and I was  unable to check an A1c given the late hour of the day.  Other considerations need to include congestive heart failure secondary to untreated sleep apnea   Updated Medication List Outpatient Encounter Prescriptions as of 07/24/2013  Medication Sig Dispense Refill  . albuterol (PROVENTIL HFA;VENTOLIN HFA) 108 (90 BASE) MCG/ACT inhaler Inhale 2 puffs into the lungs every 4 (four) hours as needed.      . fluticasone (FLONASE) 50 MCG/ACT nasal spray Place 2 sprays into the nose as needed.       . traMADol (ULTRAM) 50 MG tablet Take 1 tablet (50 mg total) by mouth every 6 (six) hours as needed for pain.  90 tablet  3   No facility-administered encounter medications on file as of 07/24/2013.

## 2013-07-24 NOTE — Assessment & Plan Note (Addendum)
Patient states that for several months she has been urinating 3-4  times per night. She Is also having frequent episodes of diaphoresis and shakiness when she fast for longer than 3 hours. i she is obese, has a high carbohydrate diet drinks sweet tea all day long.  I suspect that she may have diabetes,  but she has no glucosuria by dipstick and I was  unable to check an A1c given the late hour of the day. Other considerations need to include congestive heart failure secondary to untreated sleep apnea

## 2013-07-24 NOTE — Assessment & Plan Note (Addendum)
Need to rule out temporal arteritis given location and exam,  untreated sleep apnea given obesity, hypersomnolence, and snoring disorder; , eyestrain as she has not had a formal eye exam in several years. She has intentionally avoided using NSAIDs on a daily basis. Prescription for tramadol given pending further evaluation. Sleep study,  sedimentation rate, and C. reactive protein are pending. Patient is advised to get a formal eye exam as soon as possible. If her sedimentation rate or C-reactive protein is moderately elevated we will refer for temporal artery biopsy.

## 2013-07-24 NOTE — Assessment & Plan Note (Signed)
I have addressed  BMI and recommended a low glycemic index diet utilizing smaller more frequent meals to increase metabolism.  I have also recommended that patient start exercising with a goal of 30 minutes of aerobic exercise a minimum of 5 days per week. Screening forthyroid and diabetes to be done today.   

## 2013-07-24 NOTE — Assessment & Plan Note (Addendum)
Reportedly the daughter noticed an irregular pulse several days ago. Patient has not had any subjective feelings of palpitations as she did in 2000 when she underwent Holter monitor. EKG today is normal. She has no murmur on exam to suggest mitral valve prolapse. We'll check electrolytes and thyroid function and Will set up another Holter monitor by lower cardiology.

## 2013-07-24 NOTE — Telephone Encounter (Signed)
Patient Information:  Caller Name: Mylan  Phone: [phone]  Patient: Briana Burnett, Briana Burnett  Gender: Female  DOB: 08-18-1966  Age: 47 Years  PCP: Roxy Manns [facility])  Pregnant: No  Office Follow Up:  Does the office need to follow up with this patient?: No  Instructions For The Office: N/A  RN Note:  No chest pain.  Current pulse 80 bpm.  Daughter noted irregular heart rate 07/22/13. Moderate to severe headache now with mild lightheadedness. No appointments remain at  [facility] office; scheduled for 1615 07/24/13 with Dr Darrick Huntsman at [facility].   Symptoms  Reason For Call & Symptoms: Tachycardia with intermittent severe headaches for past 2 weeks.  Feels throbbing blood vessel at bilateral temples.  Current headache rated 7-8/10. Shortness of breath at times; Shortness of breath 07/24/13 is not as bad as 07/23/13.  Reviewed Health History In EMR: Yes  Reviewed Medications In EMR: Yes  Reviewed Allergies In EMR: Yes  Reviewed Surgeries / Procedures: Yes  Date of Onset of Symptoms: 07/10/2013  Treatments Tried: Aleve, Ibuprofen  Treatments Tried Worked: No OB / GYN:  LMP: Unknown  Guideline(s) Used:  Headache  Heart Rate and Heartbeat Questions  Disposition Per Guideline:   See Today in Office  Reason For Disposition Reached:   Patient wants to be seen  Advice Given:  Call Back If:  Headache lasts longer than 24 hours  You become worse.  Patient Will Follow Care Advice:  YES

## 2013-07-24 NOTE — Patient Instructions (Addendum)
I am referring you for a holter monitor to have your palpitations checked out, and a sleep study to rule out sleep apnea.   I am prescribing tramadol to use for your pain .  It is a pain reliever,  Not a Nonsteroidal so it will not hurt your stomach   I want you to lose 23 lbs over the next month  This is  One version of a  "Low GI"  Diet:  It will regulate your blood sugars and allow you to lose 4 to 8  lbs  per month if you follow it carefully.  Your goal with exercise is a minimum of 30 minutes of aerobic exercise 5 days per week (Walking does not count once it becomes easy!)    All of the foods can be found at grocery stores and in bulk at [employer].  The Atkins protein bars and shakes are available in more varieties at Target, WalMart and Lowe's Foods.     7 AM Breakfast:  Choose from the following:  Low carbohydrate Protein  Shakes (I recommend the EAS AdvantEdge "Carb Control" shakes  Or the low carb shakes by Atkins.    2.5 carbs   Arnold's "Sandwhich Thin"toasted  w/ peanut butter (no jelly: about 20 net carbs  "Bagel Thin" with cream cheese and salmon: about 20 carbs   a scrambled egg/bacon/cheese burrito made with Mission's "carb balance" whole wheat tortilla  (about 10 net carbs )   Avoid cereal and bananas, oatmeal and cream of wheat and grits. They are loaded with carbohydrates!   10 AM: high protein snack  Protein bar by Atkins (the snack size, under 200 cal, usually < 6 net carbs).    A stick of cheese:  Around 1 carb,  100 cal     Dannon Light n Fit [place] Yogurt  (80 cal, 8 carbs)  Other so called "protein bars" and Greek yogurts tend to be loaded with carbohydrates.  Remember, in food advertising, the word "energy" is synonymous for " carbohydrate."  Lunch:   A Sandwich using the bread choices listed, Can use any  Eggs,  lunchmeat, grilled meat or canned tuna), avocado, regular mayo/mustard  and cheese.  A Salad using blue cheese, ranch,  Goddess or vinagrette,  No  croutons or "confetti" and no "candied nuts" but regular nuts OK.   No pretzels or chips.  Pickles and miniature sweet peppers are a good low carb alternative that provide a "crunch"  The bread is the only source of carbohydrate in a sandwich and  can be decreased by trying some of these alternatives to traditional loaf bread  Joseph's makes a pita bread and a flat bread that are 50 cal and 4 net carbs available at BJs and WalMart.  This can be toasted to use with hummous as well  Toufayan makes a low carb flatbread that's 100 cal and 9 net carbs available at [employer] and [employer] makes 2 sizes of  Low carb whole wheat tortilla  (The large one is 210 cal and 6 net carbs) Avoid "Low fat dressings, as well as Reyne Dumas and [address] dressings They are loaded with sugar!   3 PM/ Mid day  Snack:  Consider  1 ounce of  almonds, walnuts, pistachios, pecans, peanuts,  Macadamia nuts or a nut medley.  Avoid "granola"; the dried cranberries and raisins are loaded with carbohydrates. Mixed nuts as long as there are no raisins,  cranberries or dried fruit.  6 PM  Dinner:     Meat/fowl/fish with a green salad, and either broccoli, cauliflower, green beans, spinach, brussel sprouts or  Lima beans. DO NOT BREAD THE PROTEIN!!      There is a low carb pasta by Dreamfield's that is acceptable and tastes great: only 5 digestible carbs/serving.( All grocery stores but BJs carry it )  Try Kai Levins Angelo's chicken piccata or chicken or eggplant parm over low carb pasta.(Lowes and BJs)   Clifton Custard Sanchez's "Carnitas" (pulled pork, no sauce,  0 carbs) or his beef pot roast to make a dinner burrito (at BJ's)  Pesto over low carb pasta (bj's sells a good quality pesto in the center refrigerated section of the deli   Whole wheat pasta is still full of digestible carbs and  Not as low in glycemic index as Dreamfield's.   Brown rice is still rice,  So skip the rice and noodles if you eat [place] or [place] (or at  least limit to 1/2 cup)  9 PM snack :   Breyer's "low carb" fudgsicle or  ice cream bar (Carb Smart line), or  Weight Watcher's ice cream bar , or another "no sugar added" ice cream;  a serving of fresh berries/cherries with whipped cream   Cheese or DANNON'S LlGHT [place] FIT [place] YOGURT  Avoid bananas, pineapple, grapes  and watermelon on a regular basis because they are high in sugar.  THINK OF THEM AS DESSERT  Remember that snack Substitutions should be less than 10 NET carbs per serving and meals < 20 carbs. Remember to subtract fiber grams to get the "net carbs."

## 2013-07-24 NOTE — Telephone Encounter (Signed)
Briana Burnett,  This patient was put on my schedule as an acute 4:15 appt ,  Because [facility] could nt' see her.  Totally inappropriate.  Spent an hour with her.,  Staff had to stay late. Eber Jones was in her car driving away when i pulled her back to do labs.  Call a Nurse had no business sending this patient here , they should have sent her to th ER if they thought it was urgent,  Or scheduled her with her regular PCP since the sympotms were going on for 2 weeks!!!!!!

## 2013-07-25 ENCOUNTER — Telehealth: Payer: Self-pay | Admitting: *Deleted

## 2013-07-25 LAB — C-REACTIVE PROTEIN: CRP: 1.3 mg/dL — ABNORMAL HIGH (ref <0.60)

## 2013-07-25 LAB — SEDIMENTATION RATE: Sed Rate: 11 mm/hr (ref 0–22)

## 2013-07-25 NOTE — Telephone Encounter (Signed)
NO ,  Your advice was appropriate.  Her blood work was mostly normal but one of her inflammatory markers was elevated.  Since her headache is persistent I would like her to have an MRA of the brain rather than subject her to a biopsy ,  I wil get this ordered at [facility].   She will need to have a BMET checked bc this was not done yesterday,  To assess kidney function, seh can get that done at [facility] if more convenient

## 2013-07-25 NOTE — Telephone Encounter (Signed)
Patient referral to cardiology is 08/13/13 reported by patient , she continues to have headache and while walking to parking deck at [facility] started with palpitations again relieved by resting in car for several minutes advised patient to go to ER if she continues to have palpitations and especially any pain or nausea noted please advise if anything should be done at this time.

## 2013-07-26 LAB — TSH+FREE T4: Free T4: 1.15 ng/dL (ref 0.82–1.77)

## 2013-07-26 NOTE — Telephone Encounter (Signed)
Left message for patient to return my call.

## 2013-07-27 NOTE — Telephone Encounter (Signed)
Left message for patient to return call.

## 2013-07-30 ENCOUNTER — Telehealth: Payer: Self-pay | Admitting: Internal Medicine

## 2013-07-30 NOTE — Telephone Encounter (Signed)
Briana Burnett from [facility] called and stated that we need to add a MRI brain with and without CM to the current order we have.

## 2013-07-30 NOTE — Telephone Encounter (Signed)
Done, thanks

## 2013-07-31 ENCOUNTER — Ambulatory Visit (HOSPITAL_COMMUNITY): Admission: RE | Admit: 2013-07-31 | Payer: BC Managed Care – PPO | Source: Ambulatory Visit

## 2013-08-01 ENCOUNTER — Telehealth: Payer: Self-pay | Admitting: Internal Medicine

## 2013-08-01 ENCOUNTER — Other Ambulatory Visit: Payer: Self-pay | Admitting: Internal Medicine

## 2013-08-01 NOTE — Telephone Encounter (Signed)
Patient "no showed" for her MRI/MRA of brain , after being seen here urgently last Friday  For persistent headaches.  Was wondering why she didn't go .  Is she going to "no show" for the sleep study too?

## 2013-08-01 NOTE — Telephone Encounter (Signed)
Patient stated she cancelled the apt. And will r/s it soon.

## 2013-08-13 ENCOUNTER — Ambulatory Visit: Payer: BC Managed Care – PPO | Admitting: Cardiovascular Disease

## 2013-08-24 ENCOUNTER — Ambulatory Visit: Payer: BC Managed Care – PPO | Admitting: Cardiovascular Disease

## 2013-09-03 ENCOUNTER — Encounter: Payer: Self-pay | Admitting: Emergency Medicine

## 2013-10-03 ENCOUNTER — Ambulatory Visit: Payer: BC Managed Care – PPO | Admitting: Family Medicine

## 2013-10-03 DIAGNOSIS — Z0289 Encounter for other administrative examinations: Secondary | ICD-10-CM

## 2014-07-01 ENCOUNTER — Other Ambulatory Visit: Payer: Self-pay

## 2014-07-01 DIAGNOSIS — Z1231 Encounter for screening mammogram for malignant neoplasm of breast: Secondary | ICD-10-CM

## 2014-07-11 ENCOUNTER — Ambulatory Visit
Admission: RE | Admit: 2014-07-11 | Discharge: 2014-07-11 | Disposition: A | Discharge status: Home / Self Care | Payer: BC Managed Care – PPO | Source: Ambulatory Visit

## 2014-07-11 ENCOUNTER — Encounter (INDEPENDENT_AMBULATORY_CARE_PROVIDER_SITE_OTHER): Payer: Self-pay

## 2014-07-11 DIAGNOSIS — Z1231 Encounter for screening mammogram for malignant neoplasm of breast: Secondary | ICD-10-CM

## 2014-07-12 ENCOUNTER — Encounter: Payer: Self-pay | Admitting: *Deleted

## 2014-12-23 ENCOUNTER — Encounter: Payer: BC Managed Care – PPO | Admitting: Family Medicine

## 2015-03-03 ENCOUNTER — Encounter: Payer: BC Managed Care – PPO | Admitting: Family Medicine

## 2015-05-21 ENCOUNTER — Ambulatory Visit (INDEPENDENT_AMBULATORY_CARE_PROVIDER_SITE_OTHER): Payer: BC Managed Care – PPO | Admitting: Family Medicine

## 2015-05-21 ENCOUNTER — Encounter: Payer: Self-pay | Admitting: Family Medicine

## 2015-05-21 VITALS — BP 106/68 | HR 66 | Temp 98.4°F | Ht 64.0 in | Wt 231.5 lb

## 2015-05-21 DIAGNOSIS — E669 Obesity, unspecified: Secondary | ICD-10-CM | POA: Diagnosis not present

## 2015-05-21 DIAGNOSIS — Z0000 Encounter for general adult medical examination without abnormal findings: Secondary | ICD-10-CM | POA: Diagnosis not present

## 2015-05-21 DIAGNOSIS — R4586 Emotional lability: Secondary | ICD-10-CM | POA: Insufficient documentation

## 2015-05-21 LAB — CBC WITH DIFFERENTIAL/PLATELET
Basophils Absolute: 0 10*3/uL (ref 0.0–0.1)
Basophils Relative: 0.3 % (ref 0.0–3.0)
EOS PCT: 1 % (ref 0.0–5.0)
Eosinophils Absolute: 0.1 10*3/uL (ref 0.0–0.7)
HCT: 39.6 % (ref 36.0–46.0)
Hemoglobin: 12.9 g/dL (ref 12.0–15.0)
LYMPHS PCT: 17.9 % (ref 12.0–46.0)
Lymphs Abs: 1.2 10*3/uL (ref 0.7–4.0)
MCHC: 32.6 g/dL (ref 30.0–36.0)
MCV: 99.9 fl (ref 78.0–100.0)
MONO ABS: 0.5 10*3/uL (ref 0.1–1.0)
Monocytes Relative: 7.9 % (ref 3.0–12.0)
NEUTROS PCT: 72.9 % (ref 43.0–77.0)
Neutro Abs: 5 10*3/uL (ref 1.4–7.7)
PLATELETS: 267 10*3/uL (ref 150.0–400.0)
RBC: 3.96 Mil/uL (ref 3.87–5.11)
RDW: 13.9 % (ref 11.5–15.5)
WBC: 6.9 10*3/uL (ref 4.0–10.5)

## 2015-05-21 LAB — COMPREHENSIVE METABOLIC PANEL
ALBUMIN: 4.1 g/dL (ref 3.5–5.2)
ALT: 20 U/L (ref 0–35)
AST: 16 U/L (ref 0–37)
Alkaline Phosphatase: 92 U/L (ref 39–117)
BUN: 11 mg/dL (ref 6–23)
CALCIUM: 9.4 mg/dL (ref 8.4–10.5)
CHLORIDE: 103 meq/L (ref 96–112)
CO2: 31 meq/L (ref 19–32)
CREATININE: 0.73 mg/dL (ref 0.40–1.20)
GFR: 90.12 mL/min (ref >60.00)
Glucose, Bld: 87 mg/dL (ref 70–99)
POTASSIUM: 4.3 meq/L (ref 3.5–5.1)
SODIUM: 138 meq/L (ref 135–145)
TOTAL PROTEIN: 7.1 g/dL (ref 6.0–8.3)
Total Bilirubin: 0.6 mg/dL (ref 0.2–1.2)

## 2015-05-21 LAB — LIPID PANEL
CHOLESTEROL: 164 mg/dL (ref 0–200)
HDL: 39.3 mg/dL (ref >39.00)
LDL Cholesterol: 109 mg/dL — ABNORMAL HIGH (ref 0–99)
NonHDL: 124.7
TRIGLYCERIDES: 81 mg/dL (ref 0.0–149.0)
Total CHOL/HDL Ratio: 4
VLDL: 16.2 mg/dL (ref 0.0–40.0)

## 2015-05-21 LAB — TSH: TSH: 0.71 u[IU]/mL (ref 0.35–4.50)

## 2015-05-21 NOTE — Patient Instructions (Signed)
Get your mammogram in Aug- 3D if affordable  Take care of yourself Cut portions and start exercise for weight loss   Labs today

## 2015-05-21 NOTE — Assessment & Plan Note (Signed)
Discussed how this problem influences overall health and the risks it imposes  Reviewed plan for weight loss with lower calorie diet (via better food choices and also portion control or program like weight watchers) and exercise building up to or more than 30 minutes 5 days per week including some aerobic activity   Stable Rev BMI Will cut portions and start exercise

## 2015-05-21 NOTE — Assessment & Plan Note (Signed)
Reviewed health habits including diet and exercise and skin cancer prevention Reviewed appropriate screening tests for age  Also reviewed health mt list, fam hx and immunization status , as well as social and family history   See HPI Lab today  Pt will get her annual mammogram in aug (3D)-has fam hx  Enc wt loss

## 2015-05-21 NOTE — Assessment & Plan Note (Addendum)
Consider SSRI if worse- some irritability on and off  Reviewed stressors/ coping techniques/symptoms/ support sources/ tx options and side effects in detail today Disc stressors

## 2015-05-21 NOTE — Progress Notes (Signed)
Pre visit review using our clinic review tool, if applicable. No additional management support is needed unless otherwise documented below in the visit note. 

## 2015-05-21 NOTE — Progress Notes (Signed)
Subjective:    Patient ID: Briana Burnett, female    DOB: Oct 21, 1966, 49 y.o.   MRN: [id number]  HPI Here for health maintenance exam and to review chronic medical problems    Feeling ok  No new problems   Wt is up 1 lb with bmi of 39  Has had a hysterectomy  Total  More irritable  Does not want medicine right now - might change her mind if it worsens  Lucky-no hot flashes  Stressors too- husband is out of work   HIV screening - declines /not high risk   Mammogram 8/15 nl  Self exam-r breast has occ tenderness/ has dense breasts  fam hx of breast cancer - sister  Getting 3D   Flu shot - did not get this season   Tdap 4/13   Due for labs   Doing better with eating /diet  Wants to start exercising - wants to start walking   Patient Active Problem List   Diagnosis Date Noted  . Obesity, unspecified 07/24/2013  . Nocturia 07/24/2013  . Enlargement of lymph nodes 02/19/2013  . Allergic rhinitis 03/13/2012  . Hearing loss in right ear 03/13/2012  . Routine general medical examination at a health care facility 11/25/2011  . New daily persistent headache 12/18/2009  . HEAD TRAUMA, CLOSED 12/18/2009  . FATIGUE 02/04/2009  . PALPITATIONS 08/16/2007   Past Medical History  Diagnosis Date  . Allergy   . Endometriosis   . Stress incontinence    Past Surgical History  Procedure Laterality Date  . Abdominal hysterectomy    . Mri  05/12/2000    brain was neg   History  Substance Use Topics  . Smoking status: Never Smoker   . Smokeless tobacco: Never Used  . Alcohol Use: No   Family History  Problem Relation Age of Onset  . COPD Mother   . Pulmonary embolism Mother   . Cancer Sister 82    breast ca  . Heart disease Father   . Mental illness Father 85    dementia  . COPD Father   . Alcohol abuse Father   . Cancer Maternal Aunt     breast ca  . Heart disease Maternal Uncle   . Heart disease Maternal Grandmother   . Cancer Maternal Aunt     breast ca     Allergies  Allergen Reactions  . Ibuprofen     Several doses causes blurred vision and light headed   Current Outpatient Prescriptions on File Prior to Visit  Medication Sig Dispense Refill  . albuterol (PROVENTIL HFA;VENTOLIN HFA) 108 (90 BASE) MCG/ACT inhaler Inhale 2 puffs into the lungs every 4 (four) hours as needed.     No current facility-administered medications on file prior to visit.       Review of Systems Review of Systems  Constitutional: Negative for fever, appetite change, fatigue and unexpected weight change.  Eyes: Negative for pain and visual disturbance.  Respiratory: Negative for cough and shortness of breath.   Cardiovascular: Negative for cp or palpitations    Gastrointestinal: Negative for nausea, diarrhea and constipation.  Genitourinary: Negative for urgency and frequency.  Skin: Negative for pallor or rash   Neurological: Negative for weakness, light-headedness, numbness and headaches.  Hematological: Negative for adenopathy. Does not bruise/bleed easily.  Psychiatric/Behavioral: Negative for dysphoric mood. The patient is not nervous/anxious.  pos for recent irritability       Objective:   Physical Exam  Constitutional: She appears well-developed  and well-nourished. No distress.  obese and well appearing   HENT:  Head: Normocephalic and atraumatic.  Right Ear: External ear normal.  Left Ear: External ear normal.  Mouth/Throat: Oropharynx is clear and moist.  Eyes: Conjunctivae and EOM are normal. Pupils are equal, round, and reactive to light. No scleral icterus.  Neck: Normal range of motion. Neck supple. No JVD present. Carotid bruit is not present. No thyromegaly present.  Cardiovascular: Normal rate, regular rhythm, normal heart sounds and intact distal pulses.  Exam reveals no gallop.   Pulmonary/Chest: Effort normal and breath sounds normal. No respiratory distress. She has no wheezes. She exhibits no tenderness.  Abdominal: Soft. Bowel  sounds are normal. She exhibits no distension, no abdominal bruit and no mass. There is no tenderness.  Genitourinary: No breast swelling, tenderness, discharge or bleeding.  Breast exam: No mass, nodules, thickening, tenderness, bulging, retraction, inflamation, nipple discharge or skin changes noted.  No axillary or clavicular LA.     Dense breasts bilaterally  Musculoskeletal: Normal range of motion. She exhibits no edema or tenderness.  Lymphadenopathy:    She has no cervical adenopathy.  Neurological: She is alert. She has normal reflexes. No cranial nerve deficit. She exhibits normal muscle tone. Coordination normal.  Skin: Skin is warm and dry. No rash noted. No erythema. No pallor.  Dermatofibroma on legs /each  Psychiatric: She has a normal mood and affect.  Pleasant           Assessment & Plan:   Problem List Items Addressed This Visit    Mood change    Consider SSRI if worse- some irritability on and off  Reviewed stressors/ coping techniques/symptoms/ support sources/ tx options and side effects in detail today Disc stressors       Obesity    Discussed how this problem influences overall health and the risks it imposes  Reviewed plan for weight loss with lower calorie diet (via better food choices and also portion control or program like weight watchers) and exercise building up to or more than 30 minutes 5 days per week including some aerobic activity   Stable Rev BMI Will cut portions and start exercise       Routine general medical examination at a health care facility - Primary    Reviewed health habits including diet and exercise and skin cancer prevention Reviewed appropriate screening tests for age  Also reviewed health mt list, fam hx and immunization status , as well as social and family history   See HPI Lab today  Pt will get her annual mammogram in aug (3D)-has fam hx  Enc wt loss       Relevant Orders   CBC with Differential/Platelet    Comprehensive metabolic panel   TSH   Lipid panel

## 2015-06-02 ENCOUNTER — Telehealth: Payer: Self-pay | Admitting: Family Medicine

## 2015-06-02 NOTE — Telephone Encounter (Signed)
Lab results given to pt and I advise her she is enrolled in mychart so we release labs there, mychart phone # given to pt so she can reset her information so she can have access to her mychart account

## 2015-06-02 NOTE — Telephone Encounter (Signed)
Pt called to check on her lab results.

## 2015-06-04 ENCOUNTER — Telehealth: Payer: Self-pay

## 2015-06-04 MED ORDER — SERTRALINE HCL 25 MG PO TABS: 25.0000 mg | ORAL_TABLET | Freq: Every day | ORAL | Status: DC

## 2015-06-04 NOTE — Telephone Encounter (Signed)
Pt left v/m; pt has decided to start med for anxiety or irritability. Discussed possibly starting pt on med at 05/21/15 CPX. [facility] [address]Please advise.

## 2015-06-04 NOTE — Telephone Encounter (Signed)
Will try zoloft 25 mg daily to start  Update me if side effects -or if mood worsens instead of improves Follow up in about 3-4 weeks  Sent electronically

## 2015-06-05 NOTE — Telephone Encounter (Signed)
Pt notified Rx sent to pharmacy and advise of Dr. Royden Purlower's comments/recommendations. Pt will call back to schedule an appt because she has to check her calender 1st

## 2015-11-04 ENCOUNTER — Encounter: Payer: Self-pay | Admitting: Medical Oncology

## 2015-11-04 ENCOUNTER — Telehealth: Payer: Self-pay | Admitting: Family Medicine

## 2015-11-04 ENCOUNTER — Emergency Department
Admission: EM | Admit: 2015-11-04 | Discharge: 2015-11-04 | Disposition: A | Discharge status: Home / Self Care | Payer: BC Managed Care – PPO | Attending: Emergency Medicine | Admitting: Emergency Medicine

## 2015-11-04 DIAGNOSIS — R079 Chest pain, unspecified: Secondary | ICD-10-CM | POA: Insufficient documentation

## 2015-11-04 DIAGNOSIS — F419 Anxiety disorder, unspecified: Secondary | ICD-10-CM | POA: Diagnosis not present

## 2015-11-04 DIAGNOSIS — R202 Paresthesia of skin: Secondary | ICD-10-CM | POA: Insufficient documentation

## 2015-11-04 DIAGNOSIS — R42 Dizziness and giddiness: Secondary | ICD-10-CM | POA: Insufficient documentation

## 2015-11-04 DIAGNOSIS — R002 Palpitations: Secondary | ICD-10-CM | POA: Insufficient documentation

## 2015-11-04 DIAGNOSIS — Z79899 Other long term (current) drug therapy: Secondary | ICD-10-CM | POA: Diagnosis not present

## 2015-11-04 LAB — CBC WITH DIFFERENTIAL/PLATELET
BASOS PCT: 1 %
Basophils Absolute: 0 10*3/uL (ref 0–0.1)
EOS ABS: 0.1 10*3/uL (ref 0–0.7)
Eosinophils Relative: 1 %
HEMATOCRIT: 38.3 % (ref 35.0–47.0)
HEMOGLOBIN: 12.8 g/dL (ref 12.0–16.0)
Lymphocytes Relative: 19 %
Lymphs Abs: 1.5 10*3/uL (ref 1.0–3.6)
MCH: 32.9 pg (ref 26.0–34.0)
MCHC: 33.3 g/dL (ref 32.0–36.0)
MCV: 98.8 fL (ref 80.0–100.0)
MONOS PCT: 10 %
Monocytes Absolute: 0.8 10*3/uL (ref 0.2–0.9)
NEUTROS ABS: 5.5 10*3/uL (ref 1.4–6.5)
NEUTROS PCT: 69 %
Platelets: 277 10*3/uL (ref 150–440)
RBC: 3.88 MIL/uL (ref 3.80–5.20)
RDW: 13.4 % (ref 11.5–14.5)
WBC: 7.9 10*3/uL (ref 3.6–11.0)

## 2015-11-04 LAB — COMPREHENSIVE METABOLIC PANEL
ALT: 24 U/L (ref 14–54)
AST: 17 U/L (ref 15–41)
Albumin: 3.8 g/dL (ref 3.5–5.0)
Alkaline Phosphatase: 85 U/L (ref 38–126)
Anion gap: 3 — ABNORMAL LOW (ref 5–15)
BILIRUBIN TOTAL: 0.1 mg/dL — AB (ref 0.3–1.2)
BUN: 15 mg/dL (ref 6–20)
CHLORIDE: 106 mmol/L (ref 101–111)
CO2: 30 mmol/L (ref 22–32)
CREATININE: 0.59 mg/dL (ref 0.44–1.00)
Calcium: 8.7 mg/dL — ABNORMAL LOW (ref 8.9–10.3)
Glucose, Bld: 128 mg/dL — ABNORMAL HIGH (ref 65–99)
POTASSIUM: 4.2 mmol/L (ref 3.5–5.1)
Sodium: 139 mmol/L (ref 135–145)
TOTAL PROTEIN: 7.3 g/dL (ref 6.5–8.1)

## 2015-11-04 LAB — TROPONIN I

## 2015-11-04 MED ORDER — ACETAMINOPHEN 500 MG PO TABS
1000.0000 mg | ORAL_TABLET | Freq: Once | ORAL | Status: AC
  Administered 2015-11-04: 1000 mg via ORAL
  Filled 2015-11-04: qty 2

## 2015-11-04 NOTE — Telephone Encounter (Signed)
Patient Name: Briana Burnett  DOB: 17-Sep-1966    Initial Comment caller states her BP is elevated, heart racing, chest pain, dizziness, labored breathing   Nurse Assessment  Nurse: Scarlette ArStandifer, RN, Heather Date/Time (Eastern Time): 11/04/2015 8:45:49 AM  Confirm and document reason for call. If symptomatic, describe symptoms. ---caller states her BP is elevated, heart racing, chest pain, dizziness, labored breathing. Caller states that she got upset this morning, then she started with these symptoms. She is still having lightheadedness and heart fluttering now, but no chest pain.  Has the patient traveled out of the country within the last 30 days? ---Not Applicable  Does the patient have any new or worsening symptoms? ---Yes  Will a triage be completed? ---Yes  Related visit to physician within the last 2 weeks? ---No  Does the PT have any chronic conditions? (i.e. diabetes, asthma, etc.) ---No  Did the patient indicate they were pregnant? ---No  Is this a behavioral health call? ---No     Guidelines    Guideline Title Affirmed Question Affirmed Notes  Heart Rate and Heartbeat Questions Dizziness, lightheadedness, or weakness    Final Disposition User   Go to ED Now Standifer, RN, Avery DennisonHeather    Referrals  [facility] - ED   Disagree/Comply: Comply

## 2015-11-04 NOTE — ED Notes (Signed)
Pt reports this am she felt like she was upset and her heart began to race and she felt lightheaded, currently pt reports chest pressure.

## 2015-11-04 NOTE — Discharge Instructions (Signed)

## 2015-11-04 NOTE — ED Provider Notes (Signed)
[facility] Emergency Department Provider Note  ____________________________________________  Time seen: 9:35 AM  I have reviewed the triage vital signs and the nursing notes.   HISTORY  Chief Complaint Anxiety and Palpitations    HPI Briana Burnett is a 49 y.o. female she had palpitations this morning. She felt like she was getting anxious and upset and with the palpitations she started to breathe rapidly felt lightheaded with tingling in her feet and hands bilaterally. She went inside and sat down and within 10 minutes she was feeling much better. No chest pain currently. She has a history of palpitations but has not been able to tolerate beta blocker therapy. Has not followed up with cardiology in 7 years.  No exertional symptoms, no dyspnea currently. No diaphoresis vomiting or radiation.   Past Medical History  Diagnosis Date  . Allergy   . Endometriosis   . Stress incontinence      Patient Active Problem List   Diagnosis Date Noted  . Mood change (HCC) 05/21/2015  . Obesity 05/21/2015  . Nocturia 07/24/2013  . Enlargement of lymph nodes 02/19/2013  . Allergic rhinitis 03/13/2012  . Hearing loss in right ear 03/13/2012  . Routine general medical examination at a health care facility 11/25/2011  . New daily persistent headache 12/18/2009  . HEAD TRAUMA, CLOSED 12/18/2009  . FATIGUE 02/04/2009  . PALPITATIONS 08/16/2007     Past Surgical History  Procedure Laterality Date  . Abdominal hysterectomy    . Mri  05/12/2000    brain was neg     Current Outpatient Rx  Name  Route  Sig  Dispense  Refill  . omeprazole (PRILOSEC) 20 MG capsule   Oral   Take 20 mg by mouth daily.            Allergies Ibuprofen   Family History  Problem Relation Age of Onset  . COPD Mother   . Pulmonary embolism Mother   . Cancer Sister [id number]    breast ca  . Heart disease Father   . Mental illness Father [id number]    dementia  . COPD Father   .  Alcohol abuse Father   . Cancer Maternal Aunt     breast ca  . Heart disease Maternal Uncle   . Heart disease Maternal Grandmother   . Cancer Maternal Aunt     breast ca    Social History Social History  Substance Use Topics  . Smoking status: Never Smoker   . Smokeless tobacco: Never Used  . Alcohol Use: No    Review of Systems  Constitutional:   No fever or chills. No weight changes Eyes:   No blurry vision or double vision.  ENT:   No sore throat. Cardiovascular:   Positive chest pain. With palpitations Respiratory:   No dyspnea or cough. Gastrointestinal:   Negative for abdominal pain, vomiting and diarrhea.  No BRBPR or melena. Genitourinary:   Negative for dysuria, urinary retention, bloody urine, or difficulty urinating. Musculoskeletal:   Negative for back pain. No joint swelling or pain. Skin:   Negative for rash. Neurological:   Negative for headaches, focal weakness or numbness. Psychiatric:  No anxiety or depression.   Endocrine:  No hot/cold intolerance, changes in energy, or sleep difficulty.  10-point ROS otherwise negative.  ____________________________________________   PHYSICAL EXAM:  VITAL SIGNS: ED Triage Vitals  Enc Vitals Group     BP 11/04/15 0924 124/71 mmHg     Pulse Rate 11/04/15 0924 73  Resp 11/04/15 0924 18     Temp 11/04/15 0924 98.1 F (36.7 C)     Temp Source 11/04/15 0924 Oral     SpO2 11/04/15 0924 97 %     Weight 11/04/15 0924 230 lb (104.327 kg)     Height 11/04/15 0924  (1.626 m)     Head Cir --      Peak Flow --      Pain Score 11/04/15 0931 3     Pain Loc --      Pain Edu? --      Excl. in GC? --      Constitutional:   Alert and oriented. Well appearing and in no distress. Eyes:   No scleral icterus. No conjunctival pallor. PERRL. EOMI ENT   Head:   Normocephalic and atraumatic.   Nose:   No congestion/rhinnorhea. No septal hematoma   Mouth/Throat:   MMM, no pharyngeal erythema. No peritonsillar  mass. No uvula shift.   Neck:   No stridor. No SubQ emphysema. No meningismus. Hematological/Lymphatic/Immunilogical:   No cervical lymphadenopathy. Cardiovascular:   RRR. Normal and symmetric distal pulses are present in all extremities. No murmurs, rubs, or gallops. Respiratory:   Normal respiratory effort without tachypnea nor retractions. Breath sounds are clear and equal bilaterally. No wheezes/rales/rhonchi. Gastrointestinal:   Soft and nontender. No distention. There is no CVA tenderness.  No rebound, rigidity, or guarding. Genitourinary:   deferred Musculoskeletal:   Nontender with normal range of motion in all extremities. No joint effusions.  No lower extremity tenderness.  No edema. Neurologic:   Normal speech and language.  CN 2-10 normal. Motor grossly intact. No pronator drift.  Normal gait. No gross focal neurologic deficits are appreciated.  Skin:    Skin is warm, dry and intact. No rash noted.  No petechiae, purpura, or bullae. Psychiatric:   Mood and affect are normal. Speech and behavior are normal. Patient exhibits appropriate insight and judgment.  ____________________________________________    LABS (pertinent positives/negatives) (all labs ordered are listed, but only abnormal results are displayed) Labs Reviewed  COMPREHENSIVE METABOLIC PANEL - Abnormal; Notable for the following:    Glucose, Bld 128 (*)    Calcium 8.7 (*)    Total Bilirubin 0.1 (*)    Anion gap 3 (*)    All other components within normal limits  TROPONIN I  CBC WITH DIFFERENTIAL/PLATELET   ____________________________________________   EKG  Interpreted by me  Date: 11/04/2015  Rate: 70  Rhythm: normal sinus rhythm  QRS Axis: normal  Intervals: normal  ST/T Wave abnormalities: normal  Conduction Disutrbances: none  Narrative Interpretation: unremarkable      ____________________________________________     RADIOLOGY    ____________________________________________   PROCEDURES   ____________________________________________   INITIAL IMPRESSION / ASSESSMENT AND PLAN / ED COURSE  Pertinent labs & imaging results that were available during my care of the patient were reviewed by me and considered in my medical decision making (see chart for details).  Patient presents with palpitations which resolved quickly. No significant risk factors for CAD. Pain is atypical for ACS. Suspicion for ACS PE TAD pneumothorax carditis mediastinitis pneumonia or sepsis. Heart score low risk. Troponin negative, EKG normal, exam nonfocal and reassuring. We'll discharge home, follow up with cardiology this week.     ____________________________________________   FINAL CLINICAL IMPRESSION(S) / ED DIAGNOSES  Final diagnoses:  Palpitations      Sharman Cheek, MD 11/04/15 1243

## 2015-12-02 ENCOUNTER — Other Ambulatory Visit: Payer: Self-pay

## 2015-12-02 ENCOUNTER — Ambulatory Visit
Admission: RE | Admit: 2015-12-02 | Discharge: 2015-12-02 | Disposition: A | Discharge status: Home / Self Care | Payer: BC Managed Care – PPO | Source: Ambulatory Visit

## 2015-12-02 DIAGNOSIS — Z1231 Encounter for screening mammogram for malignant neoplasm of breast: Secondary | ICD-10-CM

## 2015-12-19 ENCOUNTER — Telehealth: Payer: Self-pay | Admitting: Family Medicine

## 2015-12-19 NOTE — Telephone Encounter (Signed)
It looks like she had a mammogram 12/27 and it was normal -[facility]

## 2015-12-19 NOTE — Telephone Encounter (Signed)
Please call patient with Mammogram results. 

## 2015-12-22 NOTE — Telephone Encounter (Signed)
Left message on voice mail  to call back

## 2015-12-23 ENCOUNTER — Telehealth: Payer: Self-pay

## 2015-12-23 NOTE — Telephone Encounter (Signed)
Left message for pt to call back  °

## 2015-12-23 NOTE — Telephone Encounter (Signed)
Copy of mammogram results given to spouse to give to pt

## 2015-12-23 NOTE — Telephone Encounter (Signed)
-----   Message from Elsie Saas sent at 12/23/2015 10:29 AM EST ----- Regarding: ED FU Pt is coming to see Dr Mariah Milling on 01/16/16

## 2015-12-23 NOTE — Telephone Encounter (Signed)
Spoke w/ pt.  She reports that she does not feel good, has been having SOB and increased palpitations. She states that she got very upset one morning about her kitten, palpitations started and her BP was elevated.  She reports that she felt confused over the course of that day and she was taken to the ED.  Advised pt to monitor and log her BP, HR and symptoms until her appt on 01/16/16.  Asked her to call back w/ any questions before that time or if she will be unable to keep appt.

## 2016-01-08 ENCOUNTER — Encounter: Payer: Self-pay | Admitting: Internal Medicine

## 2016-01-08 ENCOUNTER — Ambulatory Visit (INDEPENDENT_AMBULATORY_CARE_PROVIDER_SITE_OTHER): Payer: BC Managed Care – PPO | Admitting: Internal Medicine

## 2016-01-08 VITALS — BP 110/76 | HR 65 | Temp 98.1°F | Wt 231.0 lb

## 2016-01-08 DIAGNOSIS — R350 Frequency of micturition: Secondary | ICD-10-CM

## 2016-01-08 DIAGNOSIS — R109 Unspecified abdominal pain: Secondary | ICD-10-CM

## 2016-01-08 DIAGNOSIS — J069 Acute upper respiratory infection, unspecified: Secondary | ICD-10-CM

## 2016-01-08 MED ORDER — AZITHROMYCIN 250 MG PO TABS: ORAL_TABLET | ORAL | Status: DC

## 2016-01-08 MED ORDER — HYDROCOD POLST-CPM POLST ER 10-8 MG/5ML PO SUER: 5.0000 mL | Freq: Every evening | ORAL | Status: DC | PRN

## 2016-01-08 NOTE — Progress Notes (Signed)
Pre visit review using our clinic review tool, if applicable. No additional management support is needed unless otherwise documented below in the visit note. 

## 2016-01-08 NOTE — Progress Notes (Signed)
HPI  Pt presents to the clinic today with c/o sore throat and cough. This started 1-2 weeks ago. She denies difficulty swallowing. The cough is productive of green mucous. She has had some associated wheezing and shortness of breath. She denies fever, chills or body aches. She has tried Nyquil and Ibuprofen with minimal relief. She does have a history of seasonal allergies. She has not had sick contacts that she is aware of.  She also reports left flank pain. This started about 3 weeks ago. The pain radiates around to her left upper abdomen. She does have some frequency but denies urgency, dysuria or blood in her urine. She does have some associated nausea but denies vomiting. She has not tried anything OTC.  Review of Systems      Past Medical History  Diagnosis Date  . Allergy   . Endometriosis   . Stress incontinence     Family History  Problem Relation Age of Onset  . COPD Mother   . Pulmonary embolism Mother   . Cancer Sister 85    breast ca  . Heart disease Father   . Mental illness Father 38    dementia  . COPD Father   . Alcohol abuse Father   . Cancer Maternal Aunt     breast ca  . Heart disease Maternal Uncle   . Heart disease Maternal Grandmother   . Cancer Maternal Aunt     breast ca    Social History   Social History  . Marital Status: Married    Spouse Name: N/A  . Number of Children: 2  . Years of Education: N/A   Occupational History  . [employer]    Social History Main Topics  . Smoking status: Never Smoker   . Smokeless tobacco: Never Used  . Alcohol Use: No  . Drug Use: No  . Sexual Activity: Not on file   Other Topics Concern  . Not on file   Social History Narrative    Allergies  Allergen Reactions  . Ibuprofen     Several doses causes blurred vision and light headed. PT states it only affects here if she takes several in a short amount of time.      Constitutional: Denies headache, fatigue, fever or  abrupt weight  changes.  HEENT:  Positive sore throat. Denies eye redness, eye pain, pressure behind the eyes, facial pain, nasal congestion, ear pain, ringing in the ears, wax buildup, runny nose or bloody nose. Respiratory: Positive cough. Denies difficulty breathing or shortness of breath.  Cardiovascular: Denies chest pain, chest tightness, palpitations or swelling in the hands or feet.  GU: Pt reports frequency and flank pain. Denies dysuria, urgency, blood in urine, odor or discharge.  No other specific complaints in a complete review of systems (except as listed in HPI above).  Objective:   BP 110/76 mmHg  Pulse 65  Temp(Src) 98.1 F (36.7 C) (Oral)  Wt 231 lb (104.781 kg)  SpO2 99%  Wt Readings from Last 3 Encounters:  01/08/16 231 lb (104.781 kg)  11/04/15 230 lb (104.327 kg)  05/21/15 231 lb 8 oz (105.008 kg)     General: Appears his stated age, well developed, well nourished in NAD. HEENT: Head: normal shape and size; Eyes: sclera white, no icterus, conjunctiva pink; Ears: Tm's gray and intact, normal light reflex; Nose: mucosa pink and moist, septum midline; Throat/Mouth: + PND. Teeth present, mucosa erythematous and moist, no exudate noted, no lesions or ulcerations noted.  Neck: Cervical lymphadenopathy.  Cardiovascular: Normal rate and rhythm. S1,S2 noted.  No murmur, rubs or gallops noted.  Pulmonary/Chest: Normal effort and positive vesicular breath sounds. No respiratory distress. No wheezes, rales or ronchi noted.  Abdomen: Soft, nontender. No CVA tenderness.    Assessment & Plan:   Upper Respiratory Infection:  Get some rest and drink plenty of water Do salt water gargles for the sore throat eRx for Azithromax x 5 days Rx for Hycodan cough syrup  Urinary frequency and flank pain:  Urinalysis: normal Push fluids Continue to monitor  RTC as needed or if symptoms persist.

## 2016-01-08 NOTE — Patient Instructions (Signed)
Upper Respiratory Infection, Adult Most upper respiratory infections (URIs) are a viral infection of the air passages leading to the lungs. A URI affects the nose, throat, and upper air passages. The most common type of URI is nasopharyngitis and is typically referred to as "the common cold." URIs run their course and usually go away on their own. Most of the time, a URI does not require medical attention, but sometimes a bacterial infection in the upper airways can follow a viral infection. This is called a secondary infection. Sinus and middle ear infections are common types of secondary upper respiratory infections. Bacterial pneumonia can also complicate a URI. A URI can worsen asthma and chronic obstructive pulmonary disease (COPD). Sometimes, these complications can require emergency medical care and may be life threatening.  CAUSES Almost all URIs are caused by viruses. A virus is a type of germ and can spread from one person to another.  RISKS FACTORS You may be at risk for a URI if:   You smoke.   You have chronic heart or lung disease.  You have a weakened defense (immune) system.   You are very young or very old.   You have nasal allergies or asthma.  You work in crowded or poorly ventilated areas.  You work in health care facilities or schools. SIGNS AND SYMPTOMS  Symptoms typically develop 2-3 days after you come in contact with a cold virus. Most viral URIs last 7-10 days. However, viral URIs from the influenza virus (flu virus) can last 14-18 days and are typically more severe. Symptoms may include:   Runny or stuffy (congested) nose.   Sneezing.   Cough.   Sore throat.   Headache.   Fatigue.   Fever.   Loss of appetite.   Pain in your forehead, behind your eyes, and over your cheekbones (sinus pain).  Muscle aches.  DIAGNOSIS  Your health care provider may diagnose a URI by:  Physical exam.  Tests to check that your symptoms are not due to  another condition such as:  Strep throat.  Sinusitis.  Pneumonia.  Asthma. TREATMENT  A URI goes away on its own with time. It cannot be cured with medicines, but medicines may be prescribed or recommended to relieve symptoms. Medicines may help:  Reduce your fever.  Reduce your cough.  Relieve nasal congestion. HOME CARE INSTRUCTIONS   Take medicines only as directed by your health care provider.   Gargle warm saltwater or take cough drops to comfort your throat as directed by your health care provider.  Use a warm mist humidifier or inhale steam from a shower to increase air moisture. This may make it easier to breathe.  Drink enough fluid to keep your urine clear or pale yellow.   Eat soups and other clear broths and maintain good nutrition.   Rest as needed.   Return to work when your temperature has returned to normal or as your health care provider advises. You may need to stay home longer to avoid infecting others. You can also use a face mask and careful hand washing to prevent spread of the virus.  Increase the usage of your inhaler if you have asthma.   Do not use any tobacco products, including cigarettes, chewing tobacco, or electronic cigarettes. If you need help quitting, ask your health care provider. PREVENTION  The best way to protect yourself from getting a cold is to practice good hygiene.   Avoid oral or hand contact with people with cold   symptoms.   Wash your hands often if contact occurs.  There is no clear evidence that vitamin C, vitamin E, echinacea, or exercise reduces the chance of developing a cold. However, it is always recommended to get plenty of rest, exercise, and practice good nutrition.  SEEK MEDICAL CARE IF:   You are getting worse rather than better.   Your symptoms are not controlled by medicine.   You have chills.  You have worsening shortness of breath.  You have brown or red mucus.  You have yellow or brown nasal  discharge.  You have pain in your face, especially when you bend forward.  You have a fever.  You have swollen neck glands.  You have pain while swallowing.  You have white areas in the back of your throat. SEEK IMMEDIATE MEDICAL CARE IF:   You have severe or persistent:  Headache.  Ear pain.  Sinus pain.  Chest pain.  You have chronic lung disease and any of the following:  Wheezing.  Prolonged cough.  Coughing up blood.  A change in your usual mucus.  You have a stiff neck.  You have changes in your:  Vision.  Hearing.  Thinking.  Mood. MAKE SURE YOU:   Understand these instructions.  Will watch your condition.  Will get help right away if you are not doing well or get worse.   This information is not intended to replace advice given to you by your health care provider. Make sure you discuss any questions you have with your health care provider.   Document Released: 05/18/2001 Document Revised: 04/08/2015 Document Reviewed: 02/27/2014 Elsevier Interactive Patient Education 2016 [employer]  

## 2016-01-09 ENCOUNTER — Ambulatory Visit: Payer: BC Managed Care – PPO | Admitting: Family Medicine

## 2016-01-16 ENCOUNTER — Encounter: Payer: Self-pay | Admitting: Cardiovascular Disease

## 2016-01-16 ENCOUNTER — Ambulatory Visit (INDEPENDENT_AMBULATORY_CARE_PROVIDER_SITE_OTHER): Payer: BC Managed Care – PPO | Admitting: Cardiovascular Disease

## 2016-01-16 ENCOUNTER — Ambulatory Visit: Payer: BC Managed Care – PPO | Admitting: Cardiovascular Disease

## 2016-01-16 VITALS — BP 120/60 | HR 68 | Ht 64.0 in | Wt 231.2 lb

## 2016-01-16 DIAGNOSIS — E669 Obesity, unspecified: Secondary | ICD-10-CM | POA: Diagnosis not present

## 2016-01-16 DIAGNOSIS — R0789 Other chest pain: Secondary | ICD-10-CM

## 2016-01-16 DIAGNOSIS — R002 Palpitations: Secondary | ICD-10-CM | POA: Diagnosis not present

## 2016-01-16 DIAGNOSIS — R0602 Shortness of breath: Secondary | ICD-10-CM | POA: Insufficient documentation

## 2016-01-16 MED ORDER — PROPRANOLOL HCL 10 MG PO TABS: 10.0000 mg | ORAL_TABLET | Freq: Three times a day (TID) | ORAL | Status: DC | PRN

## 2016-01-16 NOTE — Patient Instructions (Signed)
You are doing well.  For palpitations, Take propranolol as needed 1 to 2 pills for palpitations  Please call us if you have new issues that need to be addressed before your next appt.  Your physician wants you to follow-up in: 12 months.  You will receive a reminder letter in the mail two months in advance. If you don't receive a letter, please call our office to schedule the follow-up appointment.

## 2016-01-16 NOTE — Assessment & Plan Note (Signed)
She had shortness of breath symptoms in the setting of her tachycardia Currently blood pressure well controlled Clinical exam essentially benign, no concern of underlying cardiac pathology Suggested symptoms come back she call our office, we would consider further workup at that time

## 2016-01-16 NOTE — Assessment & Plan Note (Signed)
We have encouraged continued exercise, careful diet management in an effort to lose weight. 

## 2016-01-16 NOTE — Assessment & Plan Note (Signed)
Likely having APCs or PVCs 1 appreciated on auscultation during clinical exam We have offered Holter monitor or 30 day monitor symptoms get worse We have given her propranolol 10 mill grams to take as needed for severe symptoms She's not interested in metoprolol daily as this previously caused low blood pressure

## 2016-01-16 NOTE — Progress Notes (Signed)
Patient ID: Briana Burnett, female    DOB: Oct 16, 1966, 50 y.o.   MRN: [id number]  HPI Comments:  Briana Burnett  Is a pleasant 50 year old woman with history of obesity, palpitations who presents for evaluation of shortness of breath, palpitations/tachycardia.  Referred by  primary care physician, Dr. Milinda Antis   she reports having a long history of palpitations dating back many years  Workup in the past included a Holter monitor, echocardiogram. Results not available  She was told that she was having extra beats, had  Pauses.  Was given Toprol and took a half pill but reported having low blood pressure and stopped the medication   At the end of November 2016 she was going to work, got very upset with her " animals" " very upset".  She  Developed palpitations, tachycardia , had to sit down and recover.  Blood pressure was elevated , finally recovered to the point where she was able to drive to work.  At work she was not thinking clearly, continue to have high blood pressure.  Husband came and picked her up , went to the emergency room , was checked out and was told that everything looked okay  I was the worst episode she has had like that  Since then she has felt well with no further episodes apart from occasional palpitations  No exercise program, trying to lose weight   lab work reviewed with her in detail showing total cholesterol 160s   EKG on today's visit shows normal sinus rhythm with no significant ST or T-wave changes, heart rate 69 bpm  Allergies  Allergen Reactions  . Ibuprofen     Several doses causes blurred vision and light headed. PT states it only affects here if she takes several in a short amount of time.     Current Outpatient Prescriptions on File Prior to Visit  Medication Sig Dispense Refill  . chlorpheniramine-HYDROcodone (TUSSIONEX PENNKINETIC ER) 10-8 MG/5ML SUER Take 5 mLs by mouth at bedtime as needed for cough. 140 mL 0  . omeprazole (PRILOSEC) 20 MG capsule  Take 20 mg by mouth daily.     No current facility-administered medications on file prior to visit.    Past Medical History  Diagnosis Date  . Allergy   . Endometriosis   . Stress incontinence     Past Surgical History  Procedure Laterality Date  . Abdominal hysterectomy    . 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 She has never used smokeless tobacco. She reports that she does not drink alcohol or use illicit drugs.  Family History family history includes Alcohol abuse in her father; COPD in her father and mother; Cancer in her maternal aunt and maternal aunt; Cancer (age of onset: 57) in her sister; Heart disease in her father, maternal grandmother, and maternal uncle; Mental illness (age of onset: 65) in her father; Pulmonary embolism in her mother.   Review of Systems  Constitutional: Negative.   Respiratory: Positive for shortness of breath.   Cardiovascular: Positive for palpitations.  Gastrointestinal: Negative.   Musculoskeletal: Negative.   Neurological: Negative.   Hematological: Negative.   Psychiatric/Behavioral: Negative.   All other systems reviewed and are negative.   BP 120/60 mmHg  Pulse 68  Ht  (1.626 m)  Wt 231 lb 4 oz (104.894 kg)  BMI 39.67 kg/m2  Physical Exam  Constitutional: She is oriented to person, place, and  time. She appears well-developed and well-nourished.  Obese  HENT:  Head: Normocephalic.  Nose: Nose normal.  Mouth/Throat: Oropharynx is clear and moist.  Eyes: Conjunctivae are normal. Pupils are equal, round, and reactive to light.  Neck: Normal range of motion. Neck supple. No JVD present.  Cardiovascular: Normal rate, regular rhythm, normal heart sounds and intact distal pulses.  Exam reveals no gallop and no friction rub.   No murmur heard. Pulmonary/Chest: Effort normal and breath sounds normal. No respiratory distress. She has no wheezes. She has no rales. She exhibits no  tenderness.  Abdominal: Soft. Bowel sounds are normal. She exhibits no distension. There is no tenderness.  Musculoskeletal: Normal range of motion. She exhibits no edema or tenderness.  Lymphadenopathy:    She has no cervical adenopathy.  Neurological: She is alert and oriented to person, place, and time. Coordination normal.  Skin: Skin is warm and dry. No rash noted. No erythema.  Psychiatric: She has a normal mood and affect. Her behavior is normal. Judgment and thought content normal.

## 2016-01-20 ENCOUNTER — Ambulatory Visit (INDEPENDENT_AMBULATORY_CARE_PROVIDER_SITE_OTHER): Payer: BC Managed Care – PPO | Admitting: Family Medicine

## 2016-01-20 ENCOUNTER — Ambulatory Visit (INDEPENDENT_AMBULATORY_CARE_PROVIDER_SITE_OTHER)
Admission: RE | Admit: 2016-01-20 | Discharge: 2016-01-20 | Disposition: A | Discharge status: Home / Self Care | Payer: BC Managed Care – PPO | Source: Ambulatory Visit | Attending: Family Medicine | Admitting: Family Medicine

## 2016-01-20 ENCOUNTER — Encounter: Payer: Self-pay | Admitting: Family Medicine

## 2016-01-20 VITALS — BP 122/66 | HR 67 | Temp 98.3°F | Ht 64.0 in | Wt 231.5 lb

## 2016-01-20 DIAGNOSIS — R0600 Dyspnea, unspecified: Secondary | ICD-10-CM | POA: Diagnosis not present

## 2016-01-20 DIAGNOSIS — Z23 Encounter for immunization: Secondary | ICD-10-CM | POA: Diagnosis not present

## 2016-01-20 MED ORDER — ALBUTEROL SULFATE HFA 108 (90 BASE) MCG/ACT IN AERS: 2.0000 | INHALATION_SPRAY | RESPIRATORY_TRACT | Status: DC | PRN

## 2016-01-20 NOTE — Progress Notes (Signed)
Subjective:    Patient ID: Briana Burnett, female    DOB: [birth year], 50 y.o.   MRN: [id number]  HPI Here with problems with her breathing (acute on chronic)   Was tx for uri on 2/2 with zpak and hycodan  No longer coughing  Has not heard herself wheeze  Very tired  Head congestion is better    Family thinks they have heard her wheeze   Voice is raspy  Has a hard time "getting her breath"- taking in a deep breath - fresh air helps /cool air helps Warm air makes it worse    Exercise - not problems unless she is in a stuffy environment   Seeing cardiology for palpitations  Has PVCs /PACs likely -planning a monitor  Thought her heart was ok however  When she has a lot of skipped beats also feels a little sob She has not taken the propanolol yet - is a bit nervous to take it and she has not needed it yet   Got flu shot today   She had an inhaler in the past and it did help - then did not need it any more   ? If anxiety plays a role - stressful situations makes it worse  Worse with palpitations  Going places makes it worse  Stress level is baseline -not too bad    Patient Active Problem List   Diagnosis Date Noted  . Dyspnea 01/20/2016  . SOB (shortness of breath) 01/16/2016  . Mood change (HCC) 05/21/2015  . Obesity 05/21/2015  . Nocturia 07/24/2013  . Enlargement of lymph nodes 02/19/2013  . Allergic rhinitis 03/13/2012  . Hearing loss in right ear 03/13/2012  . Routine general medical examination at a health care facility 11/25/2011  . New daily persistent headache 12/18/2009  . HEAD TRAUMA, CLOSED 12/18/2009  . FATIGUE 02/04/2009  . PALPITATIONS 08/16/2007   Past Medical History  Diagnosis Date  . Allergy   . Endometriosis   . Stress incontinence    Past Surgical History  Procedure Laterality Date  . Abdominal hysterectomy    . 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 Smoking status: Never Smoker   . Smokeless  tobacco: Never Used  . Alcohol Use: No   Family History  Problem Relation Age of Onset  . COPD Mother   . Pulmonary embolism Mother   . Cancer Sister 16    breast ca  . Heart disease Father   . Mental illness Father 97    dementia  . COPD Father   . Alcohol abuse Father   . Cancer Maternal Aunt     breast ca  . Heart disease Maternal Uncle   . Heart disease Maternal Grandmother   . Cancer Maternal Aunt     breast ca   Allergies  Allergen Reactions  . Ibuprofen     Several doses causes blurred vision and light headed. PT states it only affects here if she takes several in a short amount of time.    Current Outpatient Prescriptions on File Prior to Visit  Medication Sig Dispense Refill  . omeprazole (PRILOSEC) 20 MG capsule Take 20 mg by mouth daily.    . chlorpheniramine-HYDROcodone (TUSSIONEX PENNKINETIC ER) 10-8 MG/5ML SUER Take 5 mLs by mouth at bedtime as needed for cough. (Patient not taking: Reported on 01/20/2016) 140 mL 0  . propranolol (INDERAL) 10 MG tablet Take 1 tablet (10 mg  total) by mouth 3 (three) times daily as needed. (Patient not taking: Reported on 01/20/2016) 90 tablet 6   No current facility-administered medications on file prior to visit.    Review of Systems    Review of Systems  Constitutional: Negative for fever, appetite change, fatigue and unexpected weight change.  Eyes: Negative for pain and visual disturbance.  ENT pos for pnd  Respiratory: Negative for cough and pos for shortness of breath.   Cardiovascular: Negative for cp or palpitations    Gastrointestinal: Negative for nausea, diarrhea and constipation. neg for heartburn Genitourinary: Negative for urgency and frequency.  Skin: Negative for pallor or rash   Neurological: Negative for weakness,  numbness and headaches. pos for occ dizziness (off balance) Hematological: Negative for adenopathy. Does not bruise/bleed easily.  Psychiatric/Behavioral: Negative for dysphoric mood. The patient  is occ nervous/anxious.      Objective:   Physical Exam  Constitutional: She appears well-developed and well-nourished. No distress.  obese and well appearing   HENT:  Head: Normocephalic and atraumatic.  Mouth/Throat: Oropharynx is clear and moist.  Eyes: Conjunctivae and EOM are normal. Pupils are equal, round, and reactive to light.  Neck: Normal range of motion. Neck supple. No JVD present. Carotid bruit is not present. No thyromegaly present.  Cardiovascular: Normal rate, regular rhythm, normal heart sounds and intact distal pulses.  Exam reveals no gallop.   Pulmonary/Chest: Effort normal and breath sounds normal. No respiratory distress. She has no wheezes. She has no rales. She exhibits no tenderness.  No crackles  Abdominal: Soft. Bowel sounds are normal. She exhibits no distension, no abdominal bruit and no mass. There is no tenderness.  Musculoskeletal: She exhibits no edema or tenderness.  Lymphadenopathy:    She has no cervical adenopathy.  Neurological: She is alert. She has normal reflexes.  Skin: Skin is warm and dry. No rash noted.  Psychiatric: Her speech is normal and behavior is normal. Thought content normal. Her mood appears anxious. Her affect is not blunt, not labile and not inappropriate. Thought content is not paranoid. She does not exhibit a depressed mood. She expresses no homicidal and no suicidal ideation.  Seems mildly anxious today          Assessment & Plan:   Problem List Items Addressed This Visit      Other   Dyspnea - Primary    Pt c/o acute on chronic sob - describing need for air /inspirational symptoms  Nl exam This sometimes correlates with her palpitations  Poss correlates with stress or anxiety cxr today Albuterol mdi to try prn  F/u for spirometry        Relevant Orders   DG Chest 2 View    Other Visit Diagnoses    Need for influenza vaccination        Relevant Orders    Flu Vaccine QUAD 36+ mos PF IM (Fluarix & Fluzone  Quad PF) (Completed)

## 2016-01-20 NOTE — Patient Instructions (Signed)
Please schedule nurse appointment for spirometry test at check out- this result will help me make a further plan  Try the inhaler as needed  Chest xray now - we will have a result in the am   Keep track of when episodes occur and what they correlate to (environmentally)

## 2016-01-20 NOTE — Progress Notes (Signed)
Pre visit review using our clinic review tool, if applicable. No additional management support is needed unless otherwise documented below in the visit note. 

## 2016-01-20 NOTE — Assessment & Plan Note (Signed)
Pt c/o acute on chronic sob - describing need for air /inspirational symptoms  Nl exam This sometimes correlates with her palpitations  Poss correlates with stress or anxiety cxr today Albuterol mdi to try prn  F/u for spirometry

## 2016-01-26 ENCOUNTER — Telehealth: Payer: Self-pay | Admitting: Cardiovascular Disease

## 2016-01-26 ENCOUNTER — Other Ambulatory Visit: Payer: Self-pay | Admitting: *Deleted

## 2016-01-26 DIAGNOSIS — R002 Palpitations: Secondary | ICD-10-CM

## 2016-01-26 DIAGNOSIS — R000 Tachycardia, unspecified: Secondary | ICD-10-CM

## 2016-01-26 NOTE — Telephone Encounter (Signed)
Pt came in to see [place] on the 10th and talked to doctor Mariah Milling that if she still had issues  That she could get a monitor  She would like to get that set up  Please call back.

## 2016-01-26 NOTE — Telephone Encounter (Signed)
We have offered Holter monitor or 30 day monitor symptoms get worse  Patient called in and stated that she has been having increased palpitations and skipping beats. Last night 2/20 at 2:00 AM she was sleeping and her heart rate was 144. She has also had some SOB with heart racing during the daytime. Would you like for me to order the 30 day event monitor for her? Thanks

## 2016-01-26 NOTE — Telephone Encounter (Signed)
LMOM for her to call back regarding monitor.

## 2016-01-26 NOTE — Telephone Encounter (Signed)
LMOM

## 2016-01-27 ENCOUNTER — Ambulatory Visit: Payer: BC Managed Care – PPO

## 2016-01-27 ENCOUNTER — Other Ambulatory Visit: Payer: Self-pay | Admitting: *Deleted

## 2016-01-27 NOTE — Telephone Encounter (Signed)
Patient returning my calls regarding the 30 day monitor. She stated that it came in the mail today. Let her know that there were detailed instructions in the box and that if she had any questions to please call back.

## 2016-01-27 NOTE — Telephone Encounter (Signed)
I agree with 30 day monitor for her symptoms

## 2016-01-28 ENCOUNTER — Ambulatory Visit (INDEPENDENT_AMBULATORY_CARE_PROVIDER_SITE_OTHER): Payer: BC Managed Care – PPO

## 2016-01-28 DIAGNOSIS — R000 Tachycardia, unspecified: Secondary | ICD-10-CM

## 2016-01-28 DIAGNOSIS — R002 Palpitations: Secondary | ICD-10-CM | POA: Diagnosis not present

## 2016-03-18 ENCOUNTER — Telehealth: Payer: Self-pay | Admitting: Cardiovascular Disease

## 2016-03-18 NOTE — Telephone Encounter (Signed)
Left detailed message on pt's vm w/ results.    "30 day monitor Normal sinus rhythm with rare PVCs.  Signed, Dossie Arbourim Gollan, MD, Ph.D [facility]"  Asked her to call back w/ any questions or concerns.

## 2016-03-18 NOTE — Telephone Encounter (Signed)
Pt calling would like results on monitor she wore.  Please call back

## 2016-09-08 ENCOUNTER — Encounter: Payer: Self-pay | Admitting: Family Medicine

## 2016-09-08 ENCOUNTER — Ambulatory Visit (INDEPENDENT_AMBULATORY_CARE_PROVIDER_SITE_OTHER): Payer: BC Managed Care – PPO | Admitting: Family Medicine

## 2016-09-08 ENCOUNTER — Ambulatory Visit (INDEPENDENT_AMBULATORY_CARE_PROVIDER_SITE_OTHER)
Admission: RE | Admit: 2016-09-08 | Discharge: 2016-09-08 | Disposition: A | Discharge status: Home / Self Care | Payer: BC Managed Care – PPO | Source: Ambulatory Visit | Attending: Family Medicine | Admitting: Family Medicine

## 2016-09-08 VITALS — BP 102/68 | HR 66 | Ht 64.0 in | Wt 233.8 lb

## 2016-09-08 DIAGNOSIS — M25475 Effusion, left foot: Secondary | ICD-10-CM

## 2016-09-08 DIAGNOSIS — M79672 Pain in left foot: Secondary | ICD-10-CM | POA: Diagnosis not present

## 2016-09-08 NOTE — Progress Notes (Signed)
Subjective:    Patient ID: Briana Burnett, female    DOB: May 06, 1966, 50 y.o.   MRN: [id number]  HPI This is a 50 yo female who presents today with pain of her left foot. She was walking yesterday when she felt a pop and developed pain that radiates from the top of her foot near her great toe up anterior leg. No ankle pain, no knee pain, no recent falls. Did not apply ice. Was wearing a G[employer] sneaker. Took ibuprofen 600 mg last night, slept ok. Pain has gotten worse with being on her foot today. Pain with flexing big toe. No ibuprofen today. 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 Has had problems in the past with bilateral plantar fasciitis.  She is concerned about a blood clot. No calf pain, redness, tenderness or swelling.  Past Medical History:  Diagnosis Date  . Allergy   . Endometriosis   . Stress incontinence    Past Surgical History:  Procedure Laterality Date  . ABDOMINAL HYSTERECTOMY    . MRI  05/12/2000   brain was neg   Family History  Problem Relation Age of Onset  . COPD Mother   . Pulmonary embolism Mother   . Cancer Sister 631    breast ca  . Heart disease Father   . Mental illness Father [phone]    dementia  . COPD Father   . Alcohol abuse Father   . Cancer Maternal Aunt     breast ca  . Heart disease Maternal Uncle   . Heart disease Maternal Grandmother   . Cancer Maternal Aunt     breast ca   Social History  Substance Use Topics  . Smoking status: Never Smoker  . Smokeless tobacco: Never Used  . Alcohol use No      Review of Systems Per HPI    Objective:   Physical Exam  Constitutional: She appears well-developed and well-nourished.  Obese.   HENT:  Head: Normocephalic and atraumatic.  Cardiovascular: Normal rate.   Pulmonary/Chest: Effort normal.  Musculoskeletal:       Left foot: There is decreased range of motion, tenderness and swelling.       Feet:  No achilies tenderness  or pain, no pretibial edema or tenderness to palpation. No calf tenderness.   Vitals reviewed.     BP 102/68   Pulse 66   Ht 5\' 4"  (1.626 m)   Wt 233 lb 12.8 oz (106.1 kg)   SpO2 99%   BMI 40.13 kg/m  Wt Readings from Last 3 Encounters:  09/08/16 233 lb 12.8 oz (106.1 kg)  01/20/16 231 lb 8 oz (105 kg)  01/16/16 231 lb 4 oz (104.9 kg)  Dg Foot Complete Left  Result Date: 09/08/2016 CLINICAL DATA:  Left foot swelling and pain. EXAM: LEFT FOOT - COMPLETE 3+ VIEW COMPARISON:  None. FINDINGS: There is no evidence of fracture or dislocation. There is no evidence of arthropathy or other focal bone abnormality. Soft tissues are unremarkable. IMPRESSION: No acute or focal abnormality. Electronically Signed   By: Maisie Fushomas  Register   On: 09/08/2016 16:25       Assessment & Plan:  1. Left foot pain - DG Foot Complete Left; Future  2. Swelling of foot joint, left - DG Foot Complete Left; Future - Xray reassuring, will treat as sprain/strain with otc analgesics, ace wrap applied, offered patient crutches but she declined - encouraged 2-3 x daily gentle  ROM - rtc/follow up if no improvement in 3-5 days  Olean Ree, FNP-BC  [facility], [facility]  09/09/2016 8:13 AM

## 2016-09-08 NOTE — Patient Instructions (Signed)
Keep wrapped during the day if more comfortable for you Ibuprofen 2 tablets every 6-8 hours, can alternate with acetaminophen Ice tonight, can try heat tomorrow  Foot Contusion A foot contusion is a deep bruise to the foot. Contusions are the result of an injury that caused bleeding under the skin. The contusion may turn blue, purple, or yellow. Minor injuries will give you a painless contusion, but more severe contusions may stay painful and swollen for a few weeks. CAUSES  A foot contusion comes from a direct blow to that area, such as a heavy object falling on the foot. SYMPTOMS   Swelling of the foot.  Discoloration of the foot.  Tenderness or soreness of the foot. DIAGNOSIS  You will have a physical exam and will be asked about your history. You may need an X-ray of your foot to look for a broken bone (fracture).  TREATMENT  An elastic wrap may be recommended to support your foot. Resting, elevating, and applying cold compresses to your foot are often the best treatments for a foot contusion. Over-the-counter medicines may also be recommended for pain control. HOME CARE INSTRUCTIONS   Put ice on the injured area.  Put ice in a plastic bag.  Place a towel between your skin and the bag.  Leave the ice on for 15-20 minutes, 03-04 times a day.  Only take over-the-counter or prescription medicines for pain, discomfort, or fever as directed by your caregiver.  If told, use an elastic wrap as directed. This can help reduce swelling. You may remove the wrap for sleeping, showering, and bathing. If your toes become numb, cold, or blue, take the wrap off and reapply it more loosely.  Elevate your foot with pillows to reduce swelling.  Try to avoid standing or walking while the foot is painful. Do not resume use until instructed by your caregiver. Then, begin use gradually. If pain develops, decrease use. Gradually increase activities that do not cause discomfort until you have normal  use of your foot.  See your caregiver as directed. It is very important to keep all follow-up appointments in order to avoid any lasting problems with your foot, including long-term (chronic) pain. SEEK IMMEDIATE MEDICAL CARE IF:   You have increased redness, swelling, or pain in your foot.  Your swelling or pain is not relieved with medicines.  You have loss of feeling in your foot or are unable to move your toes.  Your foot turns cold or blue.  You have pain when you move your toes.  Your foot becomes warm to the touch.  Your contusion does not improve in 2 days. MAKE SURE YOU:   Understand these instructions.  Will watch your condition.  Will get help right away if you are not doing well or get worse.   This information is not intended to replace advice given to you by your health care provider. Make sure you discuss any questions you have with your health care provider.   Document Released: 09/13/2006 Document Revised: 05/23/2012 Document Reviewed: 07/29/2015 Elsevier Interactive Patient Education [employer]2016 Elsevier Inc.

## 2016-10-05 ENCOUNTER — Telehealth: Payer: Self-pay | Admitting: Family Medicine

## 2016-10-05 ENCOUNTER — Encounter: Payer: Self-pay | Admitting: Family Medicine

## 2016-10-05 ENCOUNTER — Ambulatory Visit (INDEPENDENT_AMBULATORY_CARE_PROVIDER_SITE_OTHER): Payer: BC Managed Care – PPO | Admitting: Family Medicine

## 2016-10-05 VITALS — BP 118/70 | HR 61 | Temp 98.6°F | Ht 64.0 in | Wt 229.5 lb

## 2016-10-05 DIAGNOSIS — N6310 Unspecified lump in the right breast, unspecified quadrant: Secondary | ICD-10-CM

## 2016-10-05 DIAGNOSIS — N6315 Unspecified lump in the right breast, overlapping quadrants: Secondary | ICD-10-CM | POA: Insufficient documentation

## 2016-10-05 NOTE — Progress Notes (Signed)
Pre visit review using our clinic review tool, if applicable. No additional management support is needed unless otherwise documented below in the visit note. 

## 2016-10-05 NOTE — Telephone Encounter (Signed)
Sorry for the confusion.  I just wanted to make sure it was ok with you that the patient wait till 11/02/16 to have her mammogram and ultra sound.  She didn't want to go now than go back later this year to have a screening on the other breast

## 2016-10-05 NOTE — Telephone Encounter (Signed)
Sorry- I'm confused-- what do you need me to order exactly?

## 2016-10-05 NOTE — Telephone Encounter (Signed)
That is ok with me unless her lump enlarges or becomes more symptomatic  Tell her to keep me posted

## 2016-10-05 NOTE — Progress Notes (Signed)
Subjective:    Patient ID: Briana Burnett, female    DOB: 1966-09-22, 50 y.o.   MRN: [id number]  HPI Here because she found a knot in her R breast  Hard to tell because she has breast tissue that is dense  Sometimes it is sore   Sister had breast cancer at 5131 (dx at 2127)  2 maternal aunts had breast cancer  She has not had genetic testing   She has had one b9 bx in the past    Last mammogram was 12/16-normal   Patient Active Problem List   Diagnosis Date Noted  . Dyspnea 01/20/2016  . SOB (shortness of breath) 01/16/2016  . Mood change (HCC) 05/21/2015  . Obesity 05/21/2015  . Nocturia 07/24/2013  . Enlargement of lymph nodes 02/19/2013  . Allergic rhinitis 03/13/2012  . Hearing loss in right ear 03/13/2012  . Routine general medical examination at a health care facility 11/25/2011  . New daily persistent headache 12/18/2009  . HEAD TRAUMA, CLOSED 12/18/2009  . FATIGUE 02/04/2009  . PALPITATIONS 08/16/2007   Past Medical History:  Diagnosis Date  . Allergy   . Endometriosis   . Stress incontinence    Past Surgical History:  Procedure Laterality Date  . ABDOMINAL HYSTERECTOMY    . MRI  05/12/2000   brain was neg   Social History  Substance Use Topics  . Smoking status: Never Smoker  . Smokeless tobacco: Never Used  . Alcohol use No   Family History  Problem Relation Age of Onset  . COPD Mother   . Pulmonary embolism Mother   . Cancer Sister [id number]    breast ca  . Heart disease Father   . Mental illness Father [phone]    dementia  . COPD Father   . Alcohol abuse Father   . Cancer Maternal Aunt     breast ca  . Heart disease Maternal Uncle   . Heart disease Maternal Grandmother   . Cancer Maternal Aunt     breast ca   Allergies  Allergen Reactions  . Ibuprofen     Several doses causes blurred vision and light headed. PT states it only affects here if she takes several in a short amount of time.    No current outpatient prescriptions on file prior to  visit.   No current facility-administered medications on file prior to visit.      Review of Systems    Review of Systems  Constitutional: Negative for fever, appetite change, fatigue and unexpected weight change.  Eyes: Negative for pain and visual disturbance.  Respiratory: Negative for cough and shortness of breath.   Cardiovascular: Negative for cp or palpitations    Gastrointestinal: Negative for nausea, diarrhea and constipation.  Genitourinary: Negative for urgency and frequency. neg for breast d/c or skin change or itching  Skin: Negative for pallor or rash   Neurological: Negative for weakness, light-headedness, numbness and headaches.  Hematological: Negative for adenopathy. Does not bruise/bleed easily.  Psychiatric/Behavioral: Negative for dysphoric mood. The patient is not nervous/anxious.      Objective:   Physical Exam  Constitutional: She appears well-developed and well-nourished. No distress.  obese and well appearing   Eyes: Conjunctivae and EOM are normal. Pupils are equal, round, and reactive to light. No scleral icterus.  Neck: Normal range of motion. Neck supple.  Cardiovascular: Normal rate and regular rhythm.   Pulmonary/Chest: Effort normal and breath sounds normal.  Genitourinary:  Genitourinary Comments: Difficult to palpate /deep 0.5  cm mobile lump at approx 6:00 position on R breast  Dense breast tissue bilaterally  Mildly tender No nipple d/c or skin change   Musculoskeletal: She exhibits no edema.  Lymphadenopathy:    She has no cervical adenopathy.  Neurological: She is alert.  Skin: No rash noted. No erythema. No pallor.          Assessment & Plan:   Problem List Items Addressed This Visit      Other   Breast lump on right side at 6 o'clock position    In pt with strong family hx  0.5 cm deep in R breast at approx 6:00 pos Feels like dense breast tissue but cannot r/o other process  Will ref for diag mm and us       Relevant  Orders   MM DIAG BREAST TOMO BILATERAL   US BREAST LTD UNI LEFT INC AXILLA   US BREAST LTD UNI RIGHT INC AXILLA    Other Visit Diagnoses   None.

## 2016-10-05 NOTE — Telephone Encounter (Signed)
FYI Pt had her yearly mammogram 12/02/15.  [facility] stated they could do a month early for yearly mammogram this year or have another a diag order put in for only the right breast.  Pt didn't want to go back twice.  Her appointment for diag mammogram and ultra sound is 11/02/16. Is this ok with you.

## 2016-10-05 NOTE — Patient Instructions (Signed)
Stop up front for imaging referral for mammogram and ultrasound

## 2016-10-07 NOTE — Assessment & Plan Note (Signed)
In pt with strong family hx  0.5 cm deep in R breast at approx 6:00 pos Feels like dense breast tissue but cannot r/o other process  Will ref for diag mm and us

## 2016-10-07 NOTE — Telephone Encounter (Signed)
Left message asking pt to call office  °

## 2016-10-07 NOTE — Telephone Encounter (Signed)
Pt aware of dr tower note dated 10/31

## 2016-11-02 ENCOUNTER — Ambulatory Visit
Admission: RE | Admit: 2016-11-02 | Discharge: 2016-11-02 | Disposition: A | Discharge status: Home / Self Care | Payer: BC Managed Care – PPO | Source: Ambulatory Visit | Attending: Family Medicine | Admitting: Family Medicine

## 2016-11-02 DIAGNOSIS — N6310 Unspecified lump in the right breast, unspecified quadrant: Principal | ICD-10-CM

## 2016-11-02 DIAGNOSIS — N6315 Unspecified lump in the right breast, overlapping quadrants: Secondary | ICD-10-CM

## 2017-04-12 ENCOUNTER — Telehealth: Payer: Self-pay | Admitting: Cardiovascular Disease

## 2017-04-12 NOTE — Telephone Encounter (Signed)
3 attempts to schedule fu from recall list . 12 month fu per ckout 01/16/16  Deleting recall.

## 2017-04-18 ENCOUNTER — Encounter: Payer: Self-pay | Admitting: Podiatry

## 2017-04-18 ENCOUNTER — Ambulatory Visit (INDEPENDENT_AMBULATORY_CARE_PROVIDER_SITE_OTHER): Payer: BC Managed Care – PPO | Admitting: Podiatry

## 2017-04-18 ENCOUNTER — Ambulatory Visit (INDEPENDENT_AMBULATORY_CARE_PROVIDER_SITE_OTHER): Payer: BC Managed Care – PPO

## 2017-04-18 DIAGNOSIS — M779 Enthesopathy, unspecified: Secondary | ICD-10-CM

## 2017-04-18 DIAGNOSIS — B070 Plantar wart: Secondary | ICD-10-CM | POA: Diagnosis not present

## 2017-04-18 DIAGNOSIS — G5792 Unspecified mononeuropathy of left lower limb: Secondary | ICD-10-CM | POA: Diagnosis not present

## 2017-04-18 MED ORDER — MELOXICAM 15 MG PO TABS: 15.0000 mg | ORAL_TABLET | Freq: Every day | ORAL | 3 refills | Status: DC

## 2017-04-18 MED ORDER — METHYLPREDNISOLONE 4 MG PO TBPK: ORAL_TABLET | ORAL | 0 refills | Status: DC

## 2017-04-18 MED ORDER — FLUOROURACIL 5 % EX CREA: TOPICAL_CREAM | Freq: Two times a day (BID) | CUTANEOUS | 0 refills | Status: DC

## 2017-04-18 NOTE — Progress Notes (Signed)
   Subjective:    Patient ID: Briana Burnett, female    DOB: July 22, 1966, 51 y.o.   MRN: [id number]  HPI: She presents today with a chief complaint of pain to the medial ankle left and aching 3 months denies any injury to the foot she states that he gets red and swollen and by the end of the week she is in severe pain she takes Advil and rest when possible.    Review of Systems  HENT: Positive for hearing loss and tinnitus.   Cardiovascular: Positive for palpitations.  Musculoskeletal: Positive for gait problem.  Neurological: Positive for headaches.  All other systems reviewed and are negative.      Objective:   Physical Exam: She presents today strong palpable pulses bilateral neurologic sensorium is intact. Deep tendon reflexes are intact. She has pain on palpation to the saphenous nerve at the medial ankle and tenderness on palpation of the posterior tibial tendon. Radiographs today do not demonstrate any type of osseus abnormalities in this area. No open lesions or wounds are noted. Verruca plantaris left.        Assessment & Plan:  Neuritis saphenous nerve left. Posterior tibial tendinitis. Wart plantar aspect left foot.  Plan: Start her on Efudex cream I injected dexamethasone to the tendon sheath of the posterior tibial tendon I also injected Kenalog to the saphenous nerve. Started her on a Medrol Dosepak to be followed by meloxicam. We'll follow up with her in a few weeks.

## 2017-05-25 ENCOUNTER — Ambulatory Visit: Payer: BC Managed Care – PPO | Admitting: Podiatry

## 2017-08-22 ENCOUNTER — Encounter: Payer: Self-pay | Admitting: Family Medicine

## 2017-08-22 ENCOUNTER — Encounter: Payer: Self-pay | Admitting: Gastroenterology

## 2017-08-22 ENCOUNTER — Ambulatory Visit (INDEPENDENT_AMBULATORY_CARE_PROVIDER_SITE_OTHER): Payer: BC Managed Care – PPO | Admitting: Family Medicine

## 2017-08-22 VITALS — BP 116/74 | HR 57 | Temp 98.5°F | Ht 64.0 in | Wt 230.0 lb

## 2017-08-22 DIAGNOSIS — R110 Nausea: Secondary | ICD-10-CM | POA: Diagnosis not present

## 2017-08-22 DIAGNOSIS — R195 Other fecal abnormalities: Secondary | ICD-10-CM

## 2017-08-22 DIAGNOSIS — R1013 Epigastric pain: Secondary | ICD-10-CM | POA: Diagnosis not present

## 2017-08-22 MED ORDER — SUCRALFATE 1 G PO TABS: 1.0000 g | ORAL_TABLET | Freq: Three times a day (TID) | ORAL | 1 refills | Status: DC

## 2017-08-22 MED ORDER — RANITIDINE HCL 150 MG PO TABS: 150.0000 mg | ORAL_TABLET | Freq: Two times a day (BID) | ORAL | 5 refills | Status: DC

## 2017-08-22 NOTE — Patient Instructions (Signed)
Start back on zantac  Try the carafate for upper abdominal pain  Keep a diet journal   We will order an abdominal ultrasound  Also a GI referral to discuss symptoms and getting a colonoscopy   Labs today for chemistry/blood count/ pancreas and liver tests

## 2017-08-22 NOTE — Assessment & Plan Note (Signed)
Ongoing along with upper abdominal pain off and on for 2 y  Now getting worse  Pt stopped H2 blocker- rec starting back on it  Intol of ppi- causes headaches  Also px carafate for epigastric pain  Labs ordered incl lft/pancreas enzymes  Ref for US abdomen to r/u gallstones or other abnormality Ref to GI for this and lower abd issues

## 2017-08-22 NOTE — Progress Notes (Signed)
Subjective:    Patient ID: Briana Burnett, female    DOB: October 19, 1966, 51 y.o.   MRN: [id number]  HPI  Here for ongoing GI issues including diarrhea on and off for 2 y   Feels nauseated a lot  Does not think it is reflux   3 weeks ago - tiny bit of blood in stool   Diarrhea for several weeks - urgent stools every time she eats   (on and off - but when it happens it happens all day long) Stools are sometimes pale  A lot of gas   Epigastric pain -on and off (worse when she lies down)  Pain is sharp but not burning with nausea (but no vomiting)  Sometimes it moves to the left  Feels nauseated (this is often worse in the am)  Sometimes pain ref to low back  She feels very bloated   She was taking zantac for reflux - held for 2 weeks thinking it may cause the symptoms ppi (omeprazole) in the past - stopped it because it worsened her headaches 1 1/2 mo ago   No nsaids  Took one excedrin migraine a week ago    abd [place] was 2012-nl  Has never had a screening colonoscopy   No etoh  Caffeine - 30 oz tea daily  Stopped carbonation   Eating makes her feel super bloated and full  Cut way back on portions 2 wk  Fresh veg/salad sets her off  No change with dairy  Does not think stress makes her worse    Wt Readings from Last 3 Encounters:  08/22/17 230 lb (104.3 kg)  10/05/16 229 lb 8 oz (104.1 kg)  09/08/16 233 lb 12.8 oz (106.1 kg)   39.48 kg/m  Patient Active Problem List   Diagnosis Date Noted  . Epigastric pain 08/22/2017  . Loose stools 08/22/2017  . Breast lump on right side at 6 o'clock position 10/05/2016  . Dyspnea 01/20/2016  . SOB (shortness of breath) 01/16/2016  . Mood change (HCC) 05/21/2015  . Obesity 05/21/2015  . Nocturia 07/24/2013  . Enlargement of lymph nodes 02/19/2013  . Allergic rhinitis 03/13/2012  . Hearing loss in right ear 03/13/2012  . Routine general medical examination at a health care facility 11/25/2011  . New daily persistent  headache 12/18/2009  . Nausea 12/18/2009  . HEAD TRAUMA, CLOSED 12/18/2009  . FATIGUE 02/04/2009  . PALPITATIONS 08/16/2007   Past Medical History:  Diagnosis Date  . Allergy   . Endometriosis   . Stress incontinence    Past Surgical History:  Procedure Laterality Date  . ABDOMINAL HYSTERECTOMY    . BREAST EXCISIONAL BIOPSY Right    neg   . MRI  05/12/2000   brain was neg   Social History  Substance Use Topics  . Smoking status: Never Smoker  . Smokeless tobacco: Never Used  . Alcohol use No   Family History  Problem Relation Age of Onset  . COPD Mother   . Pulmonary embolism Mother   . Cancer Sister 46       breast ca  . Breast cancer Sister 63  . Heart disease Father   . Mental illness Father 55       dementia  . COPD Father   . Alcohol abuse Father   . Cancer Maternal Aunt        breast ca  . Breast cancer Maternal Aunt   . Heart disease Maternal Uncle   . Heart disease  Maternal Grandmother   . Cancer Maternal Aunt        breast ca  . Breast cancer Maternal Aunt    Allergies  Allergen Reactions  . Ibuprofen     Several doses causes blurred vision and light headed. PT states it only affects here if she takes several in a short amount of time.    No current outpatient prescriptions on file prior to visit.   No current facility-administered medications on file prior to visit.     Review of Systems  Constitutional: Positive for fatigue. Negative for activity change, appetite change, fever and unexpected weight change.  HENT: Negative for congestion, ear pain, rhinorrhea, sinus pressure and sore throat.   Eyes: Negative for pain, redness and visual disturbance.  Respiratory: Negative for cough, shortness of breath and wheezing.   Cardiovascular: Negative for chest pain and palpitations.  Gastrointestinal: Positive for abdominal distention, abdominal pain, blood in stool, diarrhea and nausea. Negative for anal bleeding, constipation, rectal pain and  vomiting.  Endocrine: Negative for polydipsia and polyuria.  Genitourinary: Negative for dysuria, frequency and urgency.  Musculoskeletal: Negative for arthralgias, back pain and myalgias.  Skin: Negative for pallor and rash.  Allergic/Immunologic: Negative for environmental allergies.  Neurological: Negative for dizziness, syncope and headaches.  Hematological: Negative for adenopathy. Does not bruise/bleed easily.  Psychiatric/Behavioral: Negative for decreased concentration and dysphoric mood. The patient is not nervous/anxious.        Objective:   Physical Exam  Constitutional: She appears well-developed and well-nourished. No distress.  obese and well appearing   HENT:  Head: Normocephalic and atraumatic.  Mouth/Throat: Oropharynx is clear and moist.  Eyes: Pupils are equal, round, and reactive to light. Conjunctivae and EOM are normal. No scleral icterus.  Neck: Normal range of motion. Neck supple.  Cardiovascular: Normal rate, regular rhythm and normal heart sounds.   Pulmonary/Chest: Effort normal and breath sounds normal. No respiratory distress. She has no wheezes. She has no rales.  Abdominal: Soft. Bowel sounds are normal. She exhibits no distension, no abdominal bruit, no ascites, no pulsatile midline mass and no mass. There is no hepatosplenomegaly. There is tenderness in the right upper quadrant, epigastric area and left upper quadrant. There is no rigidity, no rebound, no guarding, no CVA tenderness, no tenderness at McBurney's point and negative Murphy's sign.  Palp of RUQ shoots pain to LUQ Epigastric tenderness noted  Nl bs No M noted     Lymphadenopathy:    She has no cervical adenopathy.  Neurological: She is alert.  Skin: Skin is warm and dry. No erythema. No pallor.  No jaundice  Psychiatric:  Affect seems mildly anxious (regarding her symptoms)            Assessment & Plan:   Problem List Items Addressed This Visit      Other   Epigastric pain -  Primary    Sharp/intermittent with nausea (no vomiting) Intol of ppi (causes ha)  Will get back on H2 blocker Trial of carafate tid  Diet journal  Some tenderness in RUQ on exam- [place] of abdomen ordered  Lab today incl lft/pancreas enzyme  Ref to GI       Relevant Orders   Ambulatory referral to Gastroenterology   US Abdomen Complete   CBC with Differential/Platelet   Hepatic function panel   Renal function panel   Amylase   Lipase   Loose stools    Frequent loose /pale stools after eating  Also having epigastric pain /  unsure if related  Lab today  [place] of abd planned Ref to GI (also needs to discuss colon cancer screening) Urged to keep a food diary  ? IBS or other       Relevant Orders   Ambulatory referral to Gastroenterology   US Abdomen Complete   Nausea    Ongoing along with upper abdominal pain off and on for 2 y  Now getting worse  Pt stopped H2 blocker- rec starting back on it  Intol of ppi- causes headaches  Also px carafate for epigastric pain  Labs ordered incl lft/pancreas enzymes  Ref for US abdomen to r/u gallstones or other abnormality Ref to GI for this and lower abd issues       Relevant Orders   Ambulatory referral to Gastroenterology   US Abdomen Complete   CBC with Differential/Platelet   Hepatic function panel   Renal function panel   Amylase   Lipase

## 2017-08-22 NOTE — Assessment & Plan Note (Signed)
Frequent loose /pale stools after eating  Also having epigastric pain / unsure if related  Lab today  [place] of abd planned Ref to GI (also needs to discuss colon cancer screening) Urged to keep a food diary  ? IBS or other

## 2017-08-22 NOTE — Assessment & Plan Note (Signed)
Sharp/intermittent with nausea (no vomiting) Intol of ppi (causes ha)  Will get back on H2 blocker Trial of carafate tid  Diet journal  Some tenderness in RUQ on exam- [place] of abdomen ordered  Lab today incl lft/pancreas enzyme  Ref to GI

## 2017-08-23 LAB — CBC WITH DIFFERENTIAL/PLATELET
Basophils Absolute: 0.1 10*3/uL (ref 0.0–0.1)
Basophils Relative: 0.8 % (ref 0.0–3.0)
EOS PCT: 1.7 % (ref 0.0–5.0)
Eosinophils Absolute: 0.1 10*3/uL (ref 0.0–0.7)
HCT: 39.5 % (ref 36.0–46.0)
Hemoglobin: 13.2 g/dL (ref 12.0–15.0)
LYMPHS ABS: 1.4 10*3/uL (ref 0.7–4.0)
Lymphocytes Relative: 20.7 % (ref 12.0–46.0)
MCHC: 33.5 g/dL (ref 30.0–36.0)
MCV: 100.2 fl — ABNORMAL HIGH (ref 78.0–100.0)
MONO ABS: 0.7 10*3/uL (ref 0.1–1.0)
Monocytes Relative: 10.7 % (ref 3.0–12.0)
NEUTROS PCT: 66.1 % (ref 43.0–77.0)
Neutro Abs: 4.5 10*3/uL (ref 1.4–7.7)
PLATELETS: 272 10*3/uL (ref 150.0–400.0)
RBC: 3.95 Mil/uL (ref 3.87–5.11)
RDW: 13.4 % (ref 11.5–15.5)
WBC: 6.8 10*3/uL (ref 4.0–10.5)

## 2017-08-23 LAB — RENAL FUNCTION PANEL
ALBUMIN: 4.1 g/dL (ref 3.5–5.2)
BUN: 14 mg/dL (ref 6–23)
CHLORIDE: 103 meq/L (ref 96–112)
CO2: 31 mEq/L (ref 19–32)
Calcium: 9.5 mg/dL (ref 8.4–10.5)
Creatinine, Ser: 0.71 mg/dL (ref 0.40–1.20)
GFR: 92.21 mL/min (ref >60.00)
Glucose, Bld: 91 mg/dL (ref 70–99)
PHOSPHORUS: 3.8 mg/dL (ref 2.3–4.6)
POTASSIUM: 4.3 meq/L (ref 3.5–5.1)
SODIUM: 141 meq/L (ref 135–145)

## 2017-08-23 LAB — HEPATIC FUNCTION PANEL
ALT: 19 U/L (ref 0–35)
AST: 15 U/L (ref 0–37)
Albumin: 4.1 g/dL (ref 3.5–5.2)
Alkaline Phosphatase: 80 U/L (ref 39–117)
BILIRUBIN TOTAL: 0.3 mg/dL (ref 0.2–1.2)
Bilirubin, Direct: 0.1 mg/dL (ref 0.0–0.3)
Total Protein: 6.8 g/dL (ref 6.0–8.3)

## 2017-08-23 LAB — LIPASE: LIPASE: 60 U/L — AB (ref 11.0–59.0)

## 2017-08-23 LAB — AMYLASE: AMYLASE: 50 U/L (ref 27–131)

## 2017-08-27 ENCOUNTER — Ambulatory Visit (HOSPITAL_BASED_OUTPATIENT_CLINIC_OR_DEPARTMENT_OTHER)
Admission: RE | Admit: 2017-08-27 | Discharge: 2017-08-27 | Disposition: A | Discharge status: Home / Self Care | Payer: BC Managed Care – PPO | Source: Ambulatory Visit | Attending: Family Medicine | Admitting: Family Medicine

## 2017-08-27 DIAGNOSIS — R195 Other fecal abnormalities: Secondary | ICD-10-CM | POA: Diagnosis present

## 2017-08-27 DIAGNOSIS — R1013 Epigastric pain: Secondary | ICD-10-CM | POA: Diagnosis present

## 2017-08-27 DIAGNOSIS — R110 Nausea: Secondary | ICD-10-CM | POA: Diagnosis not present

## 2017-08-30 ENCOUNTER — Telehealth: Payer: Self-pay | Admitting: Family Medicine

## 2017-08-30 NOTE — Telephone Encounter (Signed)
I released both on mychart - see comments  Have her de activate mychart if she wants to  Thanks

## 2017-08-30 NOTE — Telephone Encounter (Signed)
Patient called to get the results of her lab work and ultrasound.

## 2017-08-30 NOTE — Telephone Encounter (Signed)
Pt notified of results and Dr. Royden Purl comments off of mychart. Pt was locked out of her mychart so I gave her the mychart # so she can get reset

## 2017-10-12 ENCOUNTER — Ambulatory Visit: Payer: BC Managed Care – PPO | Admitting: Gastroenterology

## 2017-10-12 ENCOUNTER — Encounter: Payer: Self-pay | Admitting: Gastroenterology

## 2017-10-12 VITALS — BP 118/64 | HR 64 | Ht 64.0 in | Wt 221.0 lb

## 2017-10-12 DIAGNOSIS — K219 Gastro-esophageal reflux disease without esophagitis: Secondary | ICD-10-CM

## 2017-10-12 DIAGNOSIS — R1013 Epigastric pain: Secondary | ICD-10-CM

## 2017-10-12 DIAGNOSIS — R194 Change in bowel habit: Secondary | ICD-10-CM

## 2017-10-12 DIAGNOSIS — Z1211 Encounter for screening for malignant neoplasm of colon: Secondary | ICD-10-CM | POA: Diagnosis not present

## 2017-10-12 MED ORDER — ONDANSETRON HCL 4 MG PO TABS: 4.0000 mg | ORAL_TABLET | Freq: Three times a day (TID) | ORAL | 1 refills | Status: DC | PRN

## 2017-10-12 MED ORDER — AMBULATORY NON FORMULARY MEDICATION: 0 refills | Status: DC

## 2017-10-12 MED ORDER — PANTOPRAZOLE SODIUM 40 MG PO TBEC: 40.0000 mg | DELAYED_RELEASE_TABLET | Freq: Every day | ORAL | 1 refills | Status: DC

## 2017-10-12 MED ORDER — ONDANSETRON 4 MG PO TBDP: 4.0000 mg | ORAL_TABLET | Freq: Three times a day (TID) | ORAL | 0 refills | Status: DC | PRN

## 2017-10-12 MED ORDER — SUPREP BOWEL PREP KIT 17.5-3.13-1.6 GM/177ML PO SOLN: ORAL | 0 refills | Status: DC

## 2017-10-12 NOTE — Patient Instructions (Addendum)
If you are age 51 or older, your body mass index should be between 23-30. Your Body mass index is 37.93 kg/m. If this is out of the aforementioned range listed, please consider follow up with your Primary Care Provider.  If you are age 51 or younger, your body mass index should be between 19-25. Your Body mass index is 37.93 kg/m. If this is out of the aformentioned range listed, please consider follow up with your Primary Care Provider.   We have sent the following medications to your pharmacy for you to pick up at your convenience: Zofran Pantoprazole  You have been scheduled for an endoscopy and colonoscopy. Please follow the written instructions given to you at your visit today. Please pick up your prep supplies at the pharmacy within the next 1-3 days. If you use inhalers (even only as needed), please bring them with you on the day of your procedure. Your physician has requested that you go to www.startemmi.com and enter the access code given to you at your visit today. This web site gives a general overview about your procedure. However, you should still follow specific instructions given to you by our office regarding your preparation for the procedure.  Thank you.

## 2017-10-12 NOTE — Progress Notes (Signed)
HPI :  51 y/o female with history of endometriosis, allergies, referred by Dr. Roxy MannsMarne Tower for epigastric / LUQ pain and nausea.   She endorses feeling nauseated for the past 2-3 years. She states when she wakes up in the morning she is very nauseated, and then also feels the same way at night when she goes to bed. During the day this feeling tends to go away. She denies any vomiting associated with nausea. She also endorses epigastric to left upper quadrant pain, this is been ongoing for a while but worse over the past 5 months. She states eating can reliably produced her symptoms. She will have pain almost instantly with eating, and it lasts about 1-2 hours before resolving. It rarely occurs in the right upper quadrant. She's had a prior ultrasound of her abdomen which showed no evidence of gallstones. She does endorse feeling heartburn routinely, despite avoiding fried or fatty foods. She also endorses regurgitation of food, particularly at night. She eats dinner around 5 PM and goes to bed between 10 and 11 PM. She denies any dysphagia. She was given a trial of Zantac but she thinks this has caused her to feel headaches. She also was given a trial of omeprazole which likewise cause headaches. She was given Carafate to take every 6 hours and she thinks this has helped reduce some of her symptoms.   She also notes she has roughly 2-3 bowel per day, soft stools, slightly increased frequency from baseline. She will often have the urge to have a bowel movement after eating. She denies any routine blood in her stools, although endorses one time episode of red blood in her stool recently. Her uncle has Crohn's disease. She takes Excedrin roughly 3 times per month, denies other NSAID use.   He denies any family history of colon cancer or gastric cancer or esophageal cancer.   She's never had a prior endoscopy. She's never had a prior colonoscopy for screening purposes.   Past Medical History:  Diagnosis  Date  . Allergy   . Endometriosis   . Stress incontinence      Past Surgical History:  Procedure Laterality Date  . ABDOMINAL HYSTERECTOMY    . BREAST EXCISIONAL BIOPSY Right    neg   . MRI  05/12/2000   brain was neg   Family History  Problem Relation Age of Onset  . COPD Mother   . Pulmonary embolism Mother   . Irritable bowel syndrome Mother   . Cancer Sister [id number]       breast ca  . Breast cancer Sister [id number]  . Heart disease Father   . Mental illness Father [id number]       dementia  . COPD Father   . Alcohol abuse Father   . Cancer Maternal Aunt        breast ca  . Breast cancer Maternal Aunt   . Heart disease Maternal Uncle   . Crohn's disease Maternal Uncle   . Heart disease Maternal Grandmother   . Cancer Maternal Aunt        breast ca  . Breast cancer Maternal Aunt    Social History   Tobacco Use  . Smoking status: Never Smoker  . Smokeless tobacco: Never Used  Substance Use Topics  . Alcohol use: No    Alcohol/week: 0.0 oz  . Drug use: No   Current Outpatient Medications  Medication Sig Dispense Refill  . ranitidine (ZANTAC) 150 MG tablet Take 1 tablet (150 mg  total) by mouth 2 (two) times daily. 60 tablet 5  . sucralfate (CARAFATE) 1 g tablet Take 1 tablet (1 g total) by mouth 4 (four) times daily -  with meals and at bedtime. 90 tablet 1   No current facility-administered medications for this visit.    Allergies  Allergen Reactions  . Ibuprofen     Several doses causes blurred vision and light headed. PT states it only affects here if she takes several in a short amount of time.      Review of Systems: All systems reviewed and negative except where noted in HPI.   Lab Results  Component Value Date   WBC 6.8 08/22/2017   HGB 13.2 08/22/2017   HCT 39.5 08/22/2017   MCV 100.2 (H) 08/22/2017   PLT 272.0 08/22/2017    Lab Results  Component Value Date   CREATININE 0.71 08/22/2017   BUN 14 08/22/2017   NA 141 08/22/2017   K 4.3 08/22/2017   CL  103 08/22/2017   CO2 31 08/22/2017    Lab Results  Component Value Date   ALT 19 08/22/2017   AST 15 08/22/2017   ALKPHOS 80 08/22/2017   BILITOT 0.3 08/22/2017     Physical Exam: BP 118/64   Pulse 64   Ht 5\' 4"  (1.626 m)   Wt 221 lb (100.2 kg)   BMI 37.93 kg/m  Constitutional: Pleasant,well-developed,female in no acute distress. HEENT: Normocephalic and atraumatic. Conjunctivae are normal. No scleral icterus. Neck supple.  Cardiovascular: Normal rate, regular rhythm.  Pulmonary/chest: Effort normal and breath sounds normal. No wheezing, rales or rhonchi. Abdominal: Soft, nondistended, protuberant, mild upper abdominal TTP. There are no masses palpable. No hepatomegaly. Extremities: no edema Lymphadenopathy: No cervical adenopathy noted. Neurological: Alert and oriented to person place and time. Skin: Skin is warm and dry. No rashes noted. Psychiatric: Normal mood and affect. Behavior is normal.   ASSESSMENT AND PLAN: 51 year old female with a history as outlined above, here for new patient consultation regarding the following issues:  GERD / upper abdominal pain - ultrasound is negative for gallstones. Labs reassuring. I discussed differential for her symptoms with her to include H. pylori gastritis, peptic ulcer disease, GERD/esophagitis, celiac disease, dyspepsia, etc. She has frequent heartburn and I think would benefit from an antacid. Unclear why Zantac and omeprazole caused headaches in the past, but would recommend trying another form to see if she tolerates it better. Will try Protonix 40 mg once daily. I will also give her a trial of Zofran as needed for nausea. Otherwise recommending an upper endoscopy to evaluate her symptoms - rule out PUD, H pylori, celiac, etc. I discussed risks and benefits of upper endoscopy with her and she wanted to proceed. Further recommendations pending the results  Change in bowel habits / colon cancer screening - she's had no prior colon  cancer screening and optical colonoscopy is recommended regardless of any symptoms. This can be done at the same time as her upper endoscopy. Following discussion of risks and benefits she wanted to proceed. Further recommendations pending the results.   Ileene PatrickSteven Tyrail Grandfield, MD [facility] Pager [phone]  CC: Tower, Audrie GallusMarne A, MD

## 2017-10-17 ENCOUNTER — Encounter: Payer: Self-pay | Admitting: Gastroenterology

## 2017-10-24 ENCOUNTER — Ambulatory Visit (AMBULATORY_SURGERY_CENTER): Payer: BC Managed Care – PPO | Admitting: Gastroenterology

## 2017-10-24 ENCOUNTER — Other Ambulatory Visit: Payer: Self-pay

## 2017-10-24 ENCOUNTER — Encounter: Payer: Self-pay | Admitting: Gastroenterology

## 2017-10-24 VITALS — BP 111/61 | HR 58 | Temp 96.2°F | Resp 10 | Ht 64.0 in | Wt 221.0 lb

## 2017-10-24 DIAGNOSIS — R194 Change in bowel habit: Secondary | ICD-10-CM | POA: Diagnosis not present

## 2017-10-24 DIAGNOSIS — R197 Diarrhea, unspecified: Secondary | ICD-10-CM | POA: Diagnosis not present

## 2017-10-24 DIAGNOSIS — K219 Gastro-esophageal reflux disease without esophagitis: Secondary | ICD-10-CM

## 2017-10-24 DIAGNOSIS — K2950 Unspecified chronic gastritis without bleeding: Secondary | ICD-10-CM | POA: Diagnosis not present

## 2017-10-24 DIAGNOSIS — R109 Unspecified abdominal pain: Secondary | ICD-10-CM

## 2017-10-24 MED ORDER — SODIUM CHLORIDE 0.9 % IV SOLN: 500.0000 mL | INTRAVENOUS | Status: DC

## 2017-10-24 NOTE — Progress Notes (Signed)
To recovery, report to RN, VSS. 

## 2017-10-24 NOTE — Patient Instructions (Signed)
YOU HAD AN ENDOSCOPIC PROCEDURE TODAY AT [facility]:   Refer to the procedure report that was given to you for any specific questions about what was found during the examination.  If the procedure report does not answer your questions, please call your gastroenterologist to clarify.  If you requested that your care partner not be given the details of your procedure findings, then the procedure report has been included in a sealed envelope for you to review at your convenience later.  YOU SHOULD EXPECT: Some feelings of bloating in the abdomen. Passage of more gas than usual.  Walking can help get rid of the air that was put into your GI tract during the procedure and reduce the bloating. If you had a lower endoscopy (such as a colonoscopy or flexible sigmoidoscopy) you may notice spotting of blood in your stool or on the toilet paper. If you underwent a bowel prep for your procedure, you may not have a normal bowel movement for a few days.  Please Note:  You might notice some irritation and congestion in your nose or some drainage.  This is from the oxygen used during your procedure.  There is no need for concern and it should clear up in a day or so.  SYMPTOMS TO REPORT IMMEDIATELY:   Following lower endoscopy (colonoscopy or flexible sigmoidoscopy):  Excessive amounts of blood in the stool  Significant tenderness or worsening of abdominal pains  Swelling of the abdomen that is new, acute  Fever of 100F or higher   Following upper endoscopy (EGD)  Vomiting of blood or coffee ground material  New chest pain or pain under the shoulder blades  Painful or persistently difficult swallowing  New shortness of breath  Fever of 100F or higher  Black, tarry-looking stools  For urgent or emergent issues, a gastroenterologist can be reached at any hour by calling [phone].   DIET:  We do recommend a small meal at first, but then you may proceed to your regular diet.  Drink  plenty of fluids but you should avoid alcoholic beverages for 24 hours.  ACTIVITY:  You should plan to take it easy for the rest of today and you should NOT DRIVE or use heavy machinery until tomorrow (because of the sedation medicines used during the test).    FOLLOW UP: Our staff will call the number listed on your records the next business day following your procedure to check on you and address any questions or concerns that you may have regarding the information given to you following your procedure. If we do not reach you, we will leave a message.  However, if you are feeling well and you are not experiencing any problems, there is no need to return our call.  We will assume that you have returned to your regular daily activities without incident.  If any biopsies were taken you will be contacted by phone or by letter within the next 1-3 weeks.  Please call us at [phone] if you have not heard about the biopsies in 3 weeks.    SIGNATURES/CONFIDENTIALITY: You and/or your care partner have signed paperwork which will be entered into your electronic medical record.  These signatures attest to the fact that that the information above on your After Visit Summary has been reviewed and is understood.  Full responsibility of the confidentiality of this discharge information lies with you and/or your care-partner.    Handout was given to your care partner on hemorrhoids.  You may resume your current medications today. Await biopsy results. Please call if any questions or concerns.   

## 2017-10-24 NOTE — Op Note (Signed)
[facility] Patient Name: Briana CapesBarbara Burnett Procedure Date: 10/24/2017 7:28 AM MRN: [id number] Endoscopist: Viviann SpareSteven P. Dalis Beers MD, MD Age: [age] Referring MD:  Date of Birth: 08/18/1966 Gender: Female Account #: [id number] Procedure:                Upper GI endoscopy Indications:              Upper abdominal pain, Heartburn, change in bowel                            habits - intolerance to protonix and other PPIs /                            H2 blockers (cause headaches) Medicines:                Monitored Anesthesia Care Procedure:                Pre-Anesthesia Assessment:                           - Prior to the procedure, a History and Physical                            was performed, and patient medications and                            allergies were reviewed. The patient's tolerance of                            previous anesthesia was also reviewed. The risks                            and benefits of the procedure and the sedation                            options and risks were discussed with the patient.                            All questions were answered, and informed consent                            was obtained. Prior Anticoagulants: The patient has                            taken no previous anticoagulant or antiplatelet                            agents. ASA Grade Assessment: II - A patient with                            mild systemic disease. After reviewing the risks                            and benefits, the patient was deemed in  satisfactory condition to undergo the procedure.                           After obtaining informed consent, the endoscope was                            passed under direct vision. Throughout the                            procedure, the patient's blood pressure, pulse, and                            oxygen saturations were monitored continuously. The                            Endoscope was  introduced through the mouth, and                            advanced to the second part of duodenum. The upper                            GI endoscopy was accomplished without difficulty.                            The patient tolerated the procedure well. Scope In: Scope Out: Findings:                 Esophagogastric landmarks were identified: the                            Z-line was found at 34 cm, the gastroesophageal                            junction was found at 34 cm and the upper extent of                            the gastric folds was found at 34 cm from the                            incisors.                           The exam of the esophagus was otherwise normal.                           The entire examined stomach was normal. Biopsies                            were taken with a cold forceps from the antrum,                            body, and incisura for Helicobacter pylori testing.  The duodenal bulb and second portion of the                            duodenum were normal. Biopsies for histology were                            taken with a cold forceps for evaluation of celiac                            disease. Complications:            No immediate complications. Estimated blood loss:                            Minimal. Estimated Blood Loss:     Estimated blood loss was minimal. Impression:               - Esophagogastric landmarks identified.                           - Normal esophagus otherwise.                           - Normal stomach. Biopsied.                           - Normal duodenal bulb and second portion of the                            duodenum. Biopsied. Recommendation:           - Patient has a contact number available for                            emergencies. The signs and symptoms of potential                            delayed complications were discussed with the                            patient. Return to  normal activities tomorrow.                            Written discharge instructions were provided to the                            patient.                           - Resume previous diet.                           - Continue present medications.                           - Await pathology results with further  recommendations. Viviann SpareSteven P. Briana Nero MD, MD 10/24/2017 8:17:27 AM This report has been signed electronically.

## 2017-10-24 NOTE — Progress Notes (Signed)
Called to room to assist during endoscopic procedure.  Patient ID and intended procedure confirmed with present staff. Received instructions for my participation in the procedure from the performing physician.  

## 2017-10-24 NOTE — Progress Notes (Signed)
No problems noted in the recovery room. maw 

## 2017-10-24 NOTE — Progress Notes (Signed)
Pt's states no medical or surgical changes since previsit or office visit. 

## 2017-10-24 NOTE — Op Note (Signed)
[facility] Patient Name: Briana CapesBarbara Burnett Procedure Date: 10/24/2017 7:55 AM MRN: [id number] Endoscopist: Viviann SpareSteven P. Armbruster MD, MD Age: [age] Referring MD:  Date of Birth: [birth year] Gender: Female Account #: [id number] Procedure:                Colonoscopy Indications:              This is the patient's first colonoscopy, Change in                            bowel habits (loose stools with urgency) which has                            persisted Medicines:                Monitored Anesthesia Care Procedure:                Pre-Anesthesia Assessment:                           - Prior to the procedure, a History and Physical                            was performed, and patient medications and                            allergies were reviewed. The patient's tolerance of                            previous anesthesia was also reviewed. The risks                            and benefits of the procedure and the sedation                            options and risks were discussed with the patient.                            All questions were answered, and informed consent                            was obtained. Prior Anticoagulants: The patient has                            taken no previous anticoagulant or antiplatelet                            agents. ASA Grade Assessment: II - A patient with                            mild systemic disease. After reviewing the risks                            and benefits, the patient was deemed in  satisfactory condition to undergo the procedure.                           After obtaining informed consent, the colonoscope                            was passed under direct vision. Throughout the                            procedure, the patient's blood pressure, pulse, and                            oxygen saturations were monitored continuously. The                            Model PCF-H190DL [id number]) scope  was introduced                            through the anus and advanced to the the terminal                            ileum, with identification of the appendiceal                            orifice and IC valve. The colonoscopy was performed                            without difficulty. The patient tolerated the                            procedure well. The quality of the bowel                            preparation was good. The terminal ileum, ileocecal                            valve, appendiceal orifice, and rectum were                            photographed. Scope In: 7:57:25 AM Scope Out: 8:08:59 AM Scope Withdrawal Time: 0 hours 10 minutes 13 seconds  Total Procedure Duration: 0 hours 11 minutes 34 seconds  Findings:                 The perianal and digital rectal examinations were                            normal.                           The terminal ileum appeared normal.                           Internal hemorrhoids were found during  retroflexion. The hemorrhoids were small.                           The exam was otherwise without abnormality. No                            obvious inflammatory changes or polyps noted.                           Biopsies for histology were taken with a cold                            forceps from the right colon, left colon and                            transverse colon for evaluation of microscopic                            colitis. Complications:            No immediate complications. Estimated blood loss:                            Minimal. Estimated Blood Loss:     Estimated blood loss was minimal. Impression:               - The examined portion of the ileum was normal.                           - Internal hemorrhoids.                           - The examination was otherwise normal. No                            inflammatory changes or polyps.                           - Biopsies were taken with a  cold forceps from the                            right colon, left colon and transverse colon for                            evaluation of microscopic colitis. Recommendation:           - Patient has a contact number available for                            emergencies. The signs and symptoms of potential                            delayed complications were discussed with the                            patient. Return to normal activities tomorrow.  Written discharge instructions were provided to the                            patient.                           - Resume previous diet.                           - Continue present medications.                           - Await pathology results.                           - Repeat colonoscopy in 10 years for screening                            purposes. Viviann SpareSteven P. Armbruster MD, MD 10/24/2017 8:14:04 AM This report has been signed electronically.

## 2017-10-25 ENCOUNTER — Telehealth: Payer: Self-pay | Admitting: *Deleted

## 2017-10-25 NOTE — Telephone Encounter (Signed)
  Follow up Call-  Call back number 10/24/2017  Post procedure Call Back phone  # [phone]  Permission to leave phone message Yes  Some recent data might be hidden     Patient questions:  Do you have a fever, pain , or abdominal swelling? No. Pain Score  0 *  Have you tolerated food without any problems? Yes.    Have you been able to return to your normal activities? Yes.    Do you have any questions about your discharge instructions: Diet   No. Medications  No. Follow up visit  No.  Do you have questions or concerns about your Care? No.  Actions: * If pain score is 4 or above: No action needed, pain <4.

## 2017-10-25 NOTE — Telephone Encounter (Signed)
No answer for post procedure call back >left message and will attempt to call back later this afternoon. Sm 

## 2017-12-21 ENCOUNTER — Telehealth: Payer: Self-pay | Admitting: Cardiovascular Disease

## 2017-12-21 NOTE — Telephone Encounter (Signed)
Left voicemail message to call back  

## 2017-12-21 NOTE — Telephone Encounter (Signed)
Patient states that she needs to schedule follow up appointment and is overdue. She reports that when she came in last year it was because she had a spell after becoming upset. She has not had anymore since then until recent. She reports that she had another spell of some shortness of breath and chest pounding while out to eat. She sat down rested and it eased off. She reports some pain on her right arm from thumb up to shoulder which is also concerning. So she really wants to see about getting a follow up appointment. Reviewed signs and symptoms to monitor for that would require immediate evaluation in the ED if needed. Advised that I would have someone call from scheduling to assist with making appointment. She requested that they call back after 3:15 PM due to her being at work. She verbalized understanding of our conversation, agreement with plan, and had no further questions at this time.

## 2017-12-21 NOTE — Telephone Encounter (Signed)
Pt states she is still having palpitations. States she had another episode where her heart was going to "beat out of my chest". States her right arm started hurting from the inside of her shoulder going all the way down to her wrist. Denies any chest pain, nausea, swelling, vomiting.  States at times she has some SOB.

## 2017-12-25 NOTE — Progress Notes (Signed)
Cardiology Office Note  Date:  12/27/2017   ID:  Briana Burnett, DOB 10/19/1966, MRN [id number]  PCP:  Judy Pimpleower, Marne A, MD   Chief Complaint  Patient presents with  . Other    Overdue follow up. Patient c/o palpitations. Patient last seen 01/16/2016. Meds reviewed verbally with patient.     HPI:  [name]  Is a pleasant 52 year old woman with history of  obesity,  palpitations /PVCs who presents for f/u of her shortness of breath, palpitations/tachycardia/PVCs  In follow-up today she reports having more symptomatic PVCs Seems to be more symptomatic when exercising, walking Also appreciates symptoms at rest Does not want medications   rare episode of tachycardia coming out of restroom at rest Lasted for several minutes Only had 2 episodes total over the past several years Symptomatic, rapid Resolved without intervention  No exercise program, trying to watch her diet  GI issues including diarrhea on and off for 2 y  Seen by GI, had EGD, no significant findings noted   history of palpitations dating back many years  Workup in the past included a Holter monitor, echocardiogram.  Previously given propranolol, did not try this, is afraid blood pressure would drop low   November 2016 she was going to work, got very upset with her " animals" " very upset".  She  Developed palpitations, tachycardia , had to sit down and recover.  Blood pressure was elevated , finally recovered to the point where she was able to drive to work.  At work she was not thinking clearly, continue to have high blood pressure.  Husband came and picked her up , went to the emergency room , was checked out and was told that everything looked okay  EKG personally reviewed by myself on todays visit Shows normal sinus rhythm rate 71 bpm, rare PVC   PMH:   has a past medical history of Allergy, Endometriosis, and Stress incontinence.  PSH:    Past Surgical History:  Procedure Laterality Date  .  ABDOMINAL HYSTERECTOMY    . BREAST EXCISIONAL BIOPSY Right    neg   . MRI  05/12/2000   brain was neg    Current Outpatient Medications  Medication Sig Dispense Refill  . pantoprazole (PROTONIX) 40 MG tablet Take 1 tablet (40 mg total) daily by mouth. 30 tablet 1   No current facility-administered medications for this visit.      Allergies:   Ibuprofen   Social History:  The patient  reports that  has never smoked. she has never used smokeless tobacco. She reports that she does not drink alcohol or use drugs.   Family History:   family history includes Alcohol abuse in her father; Breast cancer in her maternal aunt and maternal aunt; Breast cancer (age of onset: 8031) in her sister; COPD in her father and mother; Cancer in her maternal aunt and maternal aunt; Cancer (age of onset: 6931) in her sister; Crohn's disease in her maternal uncle; Heart disease in her father, maternal grandmother, and maternal uncle; Irritable bowel syndrome in her mother; Mental illness (age of onset: 3452) in her father; Pulmonary embolism in her mother.    Review of Systems: Review of Systems  Constitutional: Negative.   Respiratory: Negative.   Cardiovascular: Positive for palpitations.  Gastrointestinal: Negative.   Musculoskeletal: Negative.   Neurological: Negative.   Psychiatric/Behavioral: Negative.   All other systems reviewed and are negative.    PHYSICAL EXAM: VS:  BP 116/76 (BP Location: Left Arm, Patient  Position: Sitting, Cuff Size: Normal)   Pulse 71   Ht 5\' 4"  (1.626 m)   Wt 227 lb (103 kg)   BMI 38.96 kg/m  , BMI Body mass index is 38.96 kg/m. Constitutional:  oriented to person, place, and time. No distress.  obese HENT:  Head: Normocephalic and atraumatic.  Eyes:  no discharge. No scleral icterus.  Neck: Normal range of motion. Neck supple. No JVD present.  Cardiovascular: Normal rate, regular rhythm, normal heart sounds and intact distal pulses. Exam reveals no gallop and no  friction rub. No edema No murmur heard. Pulmonary/Chest: Effort normal and breath sounds normal. No stridor. No respiratory distress.  no wheezes.  no rales.  no tenderness.  Abdominal: Soft.  no distension.  no tenderness.  Musculoskeletal: Normal range of motion.  no  tenderness or deformity.  Neurological:  normal muscle tone. Coordination normal. No atrophy Skin: Skin is warm and dry. No rash noted. not diaphoretic.  Psychiatric:  normal mood and affect. behavior is normal. Thought content normal.    Recent Labs: 08/22/2017: ALT 19; BUN 14; Creatinine, Ser 0.71; Hemoglobin 13.2; Platelets 272.0; Potassium 4.3; Sodium 141    Lipid Panel Lab Results  Component Value Date   CHOL 164 05/21/2015   HDL 39.30 05/21/2015   LDLCALC 109 (H) 05/21/2015   TRIG 81.0 05/21/2015      Wt Readings from Last 3 Encounters:  12/27/17 227 lb (103 kg)  10/24/17 221 lb (100.2 kg)  10/12/17 221 lb (100.2 kg)       ASSESSMENT AND PLAN:  SOB (shortness of breath) - Plan: EKG 12-Lead Recommended regular walking program PVCs not likely causing her symptoms Recommended weight loss for conditioning  Palpitations - Plan: EKG 12-Lead Secondary to PVCs as below  Frequent PVCs Long discussion concerning her symptoms She does not want beta blockers We did offer propranolol as needed, Bystolic daily She's not interested at this time  Morbid obesity (HCC) We have encouraged continued exercise, careful diet management in an effort to lose weight.  Headaches Etiology unclear, likely not from PVCs Recommend follow-up with primary care She does have paravertebral muscle spasms, consider massage She is taking Excedrin daily. Discussed long-term consequences of aspirin  Tachycardia Concerning for short run of atrial tachycardia or SVT Rare episodes, 2 episodes total over the past several years Suggested she call us if she has more frequent episodes Suggested she try Valsalva, carotid sinus  massage If symptoms persisted we will try propranolol  Disposition:   F/U  as needed    Total encounter time more than 25 minutes  Greater than 50% was spent in counseling and coordination of care with the patient    Orders Placed This Encounter  Procedures  . EKG 12-Lead     Signed, Dossie Arbour, M.D., Ph.D. 12/27/2017  [facility] [employer], [place] [phone]

## 2017-12-27 ENCOUNTER — Ambulatory Visit: Payer: BC Managed Care – PPO | Admitting: Cardiovascular Disease

## 2017-12-27 ENCOUNTER — Encounter: Payer: Self-pay | Admitting: Cardiovascular Disease

## 2017-12-27 VITALS — BP 116/76 | HR 71 | Ht 64.0 in | Wt 227.0 lb

## 2017-12-27 DIAGNOSIS — R0602 Shortness of breath: Secondary | ICD-10-CM

## 2017-12-27 DIAGNOSIS — R002 Palpitations: Secondary | ICD-10-CM | POA: Diagnosis not present

## 2017-12-27 DIAGNOSIS — I493 Ventricular premature depolarization: Secondary | ICD-10-CM

## 2017-12-27 NOTE — Patient Instructions (Addendum)
Medication Instructions:   No medication changes made  Labwork:  No new labs needed  Testing/Procedures:  No further testing at this time   Follow-Up: It was a pleasure seeing you in the office today. Please call us if you have new issues that need to be addressed before your next appt.  [phone]  Your physician wants you to follow-up in:  As needed  If you need a refill on your cardiac medications before your next appointment, please call your pharmacy.     

## 2018-01-19 ENCOUNTER — Other Ambulatory Visit: Payer: Self-pay | Admitting: Gastroenterology

## 2018-02-01 ENCOUNTER — Ambulatory Visit: Payer: BC Managed Care – PPO | Admitting: Family Medicine

## 2018-02-01 ENCOUNTER — Encounter: Payer: Self-pay | Admitting: Family Medicine

## 2018-02-01 VITALS — BP 114/68 | HR 68 | Temp 98.5°F | Ht 64.0 in | Wt 226.2 lb

## 2018-02-01 DIAGNOSIS — R05 Cough: Secondary | ICD-10-CM

## 2018-02-01 DIAGNOSIS — R059 Cough, unspecified: Secondary | ICD-10-CM

## 2018-02-01 MED ORDER — AZITHROMYCIN 250 MG PO TABS: ORAL_TABLET | ORAL | 0 refills | Status: DC

## 2018-02-01 MED ORDER — ALBUTEROL SULFATE HFA 108 (90 BASE) MCG/ACT IN AERS: 2.0000 | INHALATION_SPRAY | Freq: Four times a day (QID) | RESPIRATORY_TRACT | 2 refills | Status: DC | PRN

## 2018-02-01 NOTE — Progress Notes (Signed)
   Subjective:    Patient ID: Briana AdeBarbara L Burnett, female    DOB: [birth year], 52 y.o.   MRN: [id number]  Cough  This is a new problem. The current episode started 1 to 4 weeks ago ( 2 weeks). The problem has been waxing and waning. The cough is productive of sputum. Associated symptoms include chest pain, chills, nasal congestion, postnasal drip and shortness of breath. Pertinent negatives include no ear congestion, ear pain or fever. Associated symptoms comments: Chest tightness chills early on.. The symptoms are aggravated by lying down. Risk factors: nonsmoking. Treatments tried:  dayquil and nyquil, mucinex. The treatment provided mild relief. Her past medical history is significant for bronchitis and environmental allergies. There is no history of asthma or COPD.    Blood pressure 114/68, pulse 68, temperature 98.5 F (36.9 C), temperature source Oral, height 5\' 4"  (1.626 m), weight 226 lb 4 oz (102.6 kg), SpO2 98 %.  Review of Systems  Constitutional: Positive for chills. Negative for fever.  HENT: Positive for postnasal drip. Negative for ear pain.   Respiratory: Positive for cough and shortness of breath.   Cardiovascular: Positive for chest pain.  Allergic/Immunologic: Positive for environmental allergies.       Objective:   Physical Exam  Constitutional: Vital signs are normal. She appears well-developed and well-nourished. She is cooperative.  Non-toxic appearance. She does not appear ill. No distress.  HENT:  Head: Normocephalic.  Right Ear: Hearing, tympanic membrane, external ear and ear canal normal. Tympanic membrane is not erythematous, not retracted and not bulging.  Left Ear: Hearing, tympanic membrane, external ear and ear canal normal. Tympanic membrane is not erythematous, not retracted and not bulging.  Nose: Mucosal edema and rhinorrhea present. Right sinus exhibits no maxillary sinus tenderness and no frontal sinus tenderness. Left sinus exhibits no maxillary sinus  tenderness and no frontal sinus tenderness.  Mouth/Throat: Uvula is midline, oropharynx is clear and moist and mucous membranes are normal.  Eyes: Conjunctivae, EOM and lids are normal. Pupils are equal, round, and reactive to light. Lids are everted and swept, no foreign bodies found.  Neck: Trachea normal and normal range of motion. Neck supple. Carotid bruit is not present. No thyroid mass and no thyromegaly present.  Cardiovascular: Normal rate, regular rhythm, S1 normal, S2 normal, normal heart sounds, intact distal pulses and normal pulses. Exam reveals no gallop and no friction rub.  No murmur heard. Pulmonary/Chest: Effort normal and breath sounds normal. No tachypnea. No respiratory distress. She has no decreased breath sounds. She has no wheezes. She has no rhonchi. She has no rales.  Neurological: She is alert.  Skin: Skin is warm, dry and intact. No rash noted.  Psychiatric: Her speech is normal and behavior is normal. Judgment normal. Her mood appears not anxious. Cognition and memory are normal. She does not exhibit a depressed mood.          Assessment & Plan:

## 2018-02-01 NOTE — Patient Instructions (Addendum)
Mucinex DM twice daily.  Flonase 2 sprays per nostril daily.  Albuterol as needed for shortness of breath.  Complete course of antibiotics.  Rest. Fluids. Go to ER if severe shortness of breath.

## 2018-02-07 DIAGNOSIS — R05 Cough: Secondary | ICD-10-CM | POA: Insufficient documentation

## 2018-02-07 DIAGNOSIS — R059 Cough, unspecified: Secondary | ICD-10-CM | POA: Insufficient documentation

## 2018-02-07 NOTE — Assessment & Plan Note (Signed)
Mucinex DM twice daily.  Flonase 2 sprays per nostril daily.  Albuterol as needed for shortness of breath.  Complete course of antibiotics.  Rest. Fluids. Go to ER if severe shortness of breath.

## 2018-02-14 ENCOUNTER — Ambulatory Visit: Payer: Self-pay

## 2018-02-14 NOTE — Telephone Encounter (Signed)
Pt calling with concern that she may have the Flu. Pt denies fever, cough,.body aches runny nose. Pt had several episodes of vomiting last night and having severe diarrhea. This am pt stated the vomiting has stopped. Pt has tolerated ice chips and 1 cup of beverage (water and soda). Pt c/o headache. Advised pt to take acetaminophen (pt allergic to Ibuprofen). Care advice given.  Pt advised to call back if showing signs of dehydration, diarrhea persists over 7 days or if she becomes worse.   Reason for Disposition . SEVERE diarrhea (e.g., 7 or more times / day more than normal)  Answer Assessment - Initial Assessment Questions 1. DIARRHEA SEVERITY: "How bad is the diarrhea?" "How many extra stools have you had in the past 24 hours than normal?"    - MILD: Few loose or mushy BMs; increase of 1-3 stools over normal daily number of stools; mild increase in ostomy output.   - MODERATE: Increase of 4-6 stools daily over normal; moderate increase in ostomy output.   - SEVERE (or Worst Possible): Increase of 7 or more stools daily over normal; moderate increase in ostomy output; incontinence.     severe 2. ONSET: "When did the diarrhea begin?"      Last night 3. BM CONSISTENCY: "How loose or watery is the diarrhea?"    watery 4. VOMITING: "Are you also vomiting?" If so, ask: "How many times in the past 24 hours?"      6-7 5. ABDOMINAL PAIN: "Are you having any abdominal pain?" If yes: "What does it feel like?" (e.g., crampy, dull, intermittent, constant)      yes 6. ABDOMINAL PAIN SEVERITY: If present, ask: "How bad is the pain?"  (e.g., Scale 1-10; mild, moderate, or severe)    - MILD (1-3): doesn't interfere with normal activities, abdomen soft and not tender to touch     - MODERATE (4-7): interferes with normal activities or awakens from sleep, tender to touch     - SEVERE (8-10): excruciating pain, doubled over, unable to do any normal activities       mild 7. ORAL INTAKE: If vomiting, "Have you  been able to drink liquids?" "How much fluids have you had in the past 24 hours?"     Yes ice chips and half a cup of water and half a cup 8. HYDRATION: "Any signs of dehydration?" (e.g., dry mouth [not just dry lips], too weak to stand, dizziness, new weight loss) "When did you last urinate?"     Weak but can stand voided an hour ago 9. EXPOSURE: "Have you traveled to a foreign country recently?" "Have you been exposed to anyone with diarrhea?" "Could you have eaten any food that was spoiled?"     No- went out Saturday night and started c/o stomach hurting from salad, ribeye steak 10. OTHER SYMPTOMS: "Do you have any other symptoms?" (e.g., fever, blood in stool)       Headache, mild abdominal pain 11. PREGNANCY: "Is there any chance you are pregnant?" "When was your last menstrual period?"       n/a  Protocols used: DIARRHEA-A-AH

## 2018-03-22 ENCOUNTER — Telehealth: Payer: Self-pay

## 2018-03-22 NOTE — Telephone Encounter (Signed)
I do not see recent f/u or annual exam; last acute 02/01/18.Please advise.

## 2018-03-22 NOTE — Telephone Encounter (Signed)
Copied from CRM [id number]. Topic: General - Other >> Mar 22, 2018  2:19 PM Debroah LoopLander, Lumin L wrote: Reason for CRM: Patient calling because Dr. Derryl HarborBower at [facility] want her to see if she needs surgical clearance for her surgery on 04/29? Patient would like a call back to clarify.

## 2018-03-22 NOTE — Telephone Encounter (Signed)
Please schedule an appt for surgical clearance  Thanks

## 2018-03-23 ENCOUNTER — Ambulatory Visit: Payer: Self-pay | Admitting: Orthopedic Surgery

## 2018-03-23 NOTE — Telephone Encounter (Signed)
appt scheduled

## 2018-03-24 ENCOUNTER — Telehealth: Payer: Self-pay | Admitting: Cardiovascular Disease

## 2018-03-24 NOTE — Telephone Encounter (Signed)
Acceptable risk for surgery No further testing needed 

## 2018-03-24 NOTE — Telephone Encounter (Signed)
Clearance routed to number listed. 

## 2018-03-24 NOTE — Telephone Encounter (Signed)
   [facility] Pre-operative Risk Assessment    Request for surgical clearance:  1. What type of surgery is being performed? Shoulder arthroscopy  2. When is this surgery scheduled? 04/03/18  3. What type of clearance is required (medical clearance vs. Pharmacy clearance to hold med vs. Both)? Not lested  4. Are there any medications that need to be held prior to surgery and how long?not listed  5. Practice name and name of physician performing surgery? [facility], Dr. Harlow Mares  6. What is your office phone number [phone]   7.   What is your office fax number [phone]  8.   Anesthesia type (None, local, MAC, general) ? Not listed   Briana Burnett 03/24/2018, 4:34 PM  _________________________________________________________________   (provider comments below)

## 2018-03-27 ENCOUNTER — Encounter: Payer: Self-pay | Admitting: Family Medicine

## 2018-03-27 ENCOUNTER — Ambulatory Visit (INDEPENDENT_AMBULATORY_CARE_PROVIDER_SITE_OTHER): Payer: BC Managed Care – PPO | Admitting: Family Medicine

## 2018-03-27 VITALS — BP 112/66 | HR 78 | Temp 98.2°F | Ht 64.0 in | Wt 225.5 lb

## 2018-03-27 DIAGNOSIS — Z01818 Encounter for other preprocedural examination: Secondary | ICD-10-CM | POA: Insufficient documentation

## 2018-03-27 DIAGNOSIS — E669 Obesity, unspecified: Secondary | ICD-10-CM

## 2018-03-27 LAB — POC URINALSYSI DIPSTICK (AUTOMATED)
Bilirubin, UA: NEGATIVE
Glucose, UA: 250
KETONES UA: NEGATIVE
LEUKOCYTES UA: NEGATIVE
Nitrite, UA: NEGATIVE
PH UA: 6 (ref 5.0–8.0)
PROTEIN UA: NEGATIVE
RBC UA: NEGATIVE
SPEC GRAV UA: 1.025 (ref 1.010–1.025)
Urobilinogen, UA: 0.2 E.U./dL

## 2018-03-27 NOTE — Assessment & Plan Note (Signed)
No restrictions for upcoming shoulder rotator cuff repair Monday  Labs pending  Clear UA  Disc drug intolerances-will disc nausea with her anesthesiologist  Has been cleared by cardiology

## 2018-03-27 NOTE — Patient Instructions (Addendum)
Let the anesthesia doctor know you get nauseated with surgery   You are cleared for surgery - when labs return   Good luck with surgery and recovery  Get back to weight loss effort when you can

## 2018-03-27 NOTE — Assessment & Plan Note (Signed)
Discussed how this problem influences overall health and the risks it imposes  Reviewed plan for weight loss with lower calorie diet (via better food choices and also portion control or program like weight watchers) and exercise building up to or more than 30 minutes 5 days per week including some aerobic activity   Pt lost wt and gained it back after a shoulder injury Plans on getting back on track after shoulder surgery and recovery Enc water intake and healthy diet

## 2018-03-27 NOTE — Progress Notes (Signed)
Subjective:    Patient ID: Briana Burnett, female    DOB: May 29, 1966, 52 y.o.   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 Doing best she can with water intake  38.71 kg/m   Last labs were sept 2018 Lab Results  Component Value Date   WBC 6.8 08/22/2017   HGB 13.2 08/22/2017   HCT 39.5 08/22/2017   MCV 100.2 (H) 08/22/2017   PLT 272.0 08/22/2017     Chemistry      Component Value Date/Time   NA 141 08/22/2017 1623   K 4.3 08/22/2017 1623   CL 103 08/22/2017 1623   CO2 31 08/22/2017 1623   BUN 14 08/22/2017 1623   CREATININE 0.71 08/22/2017 1623      Component Value Date/Time   CALCIUM 9.5 08/22/2017 1623   ALKPHOS 80 08/22/2017 1623   AST 15 08/22/2017 1623   ALT 19 08/22/2017 1623   BILITOT 0.3 08/22/2017 1623      She has never smoked    No hx of blood clots or anticoag   Med rxn= cortisone (caused uvular swelling and throat blisters)  Ibuprofen caused light headedness and blurred vision  She does ok with other medicines for pain - she does get sedated with other medicines   surg hx- abd hysterectomy and breast bx  Anesthesia  She does get nauseated with it -aware of that   Taking protonix for her reflux -helps a lot /less need when she is able to loose weight    Patient Active Problem List   Diagnosis Date Noted  . Preoperative examination 03/27/2018  . 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 Epigastric pain 08/22/2017  . Loose stools 08/22/2017  . SOB (shortness of  breath) 01/16/2016  . Mood change 05/21/2015  . Obesity (BMI 30-39.9) 05/21/2015  . Nocturia 07/24/2013  . Allergic rhinitis 03/13/2012  . Hearing loss in right ear 03/13/2012  . Routine general medical examination at a health care facility 11/25/2011  . New daily persistent headache 12/18/2009  . Nausea 12/18/2009  . FATIGUE 02/04/2009  . PALPITATIONS 08/16/2007   Past Medical History:  Diagnosis Date  . Allergy   . Endometriosis   . Stress incontinence    Past Surgical History:  Procedure Laterality Date  . ABDOMINAL HYSTERECTOMY    . BREAST EXCISIONAL BIOPSY Right    neg   . MRI  05/12/2000   brain was neg   Social History   Tobacco Use  . Smoking status: Never Smoker  . Smokeless tobacco: Never Used  Substance Use Topics  . Alcohol use: No    Alcohol/week: 0.0 oz  . Drug use: No   Family History  Problem Relation Age of Onset  . COPD Mother   . Pulmonary embolism Mother   . Irritable bowel syndrome Mother   . Cancer Sister 88       breast ca  .  Breast cancer Sister 531  . Heart disease Father   . Mental illness Father 5552       dementia  . COPD Father   . Alcohol abuse Father   . Cancer Maternal Aunt        breast ca  . Breast cancer Maternal Aunt   . Heart disease Maternal Uncle   . Crohn's disease Maternal Uncle   . Heart disease Maternal Grandmother   . Cancer Maternal Aunt        breast ca  . Breast cancer Maternal Aunt    Allergies  Allergen Reactions  . Cortisone Other (See Comments)    Cortisone shot caused uvula swelling, blisters in throat  . Ibuprofen Other (See Comments)    Several doses causes blurred vision and light headed. PT states it only affects here if she takes several in a short amount of time.    Current Outpatient Medications on File Prior to Visit  Medication Sig Dispense Refill  . cyclobenzaprine (FLEXERIL) 10 MG tablet Take 10 mg by mouth 3 (three) times daily as needed for muscle spasms.    . pantoprazole (PROTONIX) 40  MG tablet TAKE 1 TABLET(40 MG) BY MOUTH DAILY (Patient taking differently: Take 40 mg by mouth every other day) 30 tablet 3  . traMADol (ULTRAM) 50 MG tablet Take 50 mg by mouth every 6 (six) hours as needed for moderate pain.     No current facility-administered medications on file prior to visit.     Review of Systems  Constitutional: Negative for activity change, appetite change, fatigue, fever and unexpected weight change.  HENT: Negative for congestion, ear pain, rhinorrhea, sinus pressure and sore throat.   Eyes: Negative for pain, redness and visual disturbance.  Respiratory: Negative for cough, shortness of breath and wheezing.   Cardiovascular: Positive for palpitations. Negative for chest pain.       Occ tachycardia with activity  Cleared by cardiolgy  Gastrointestinal: Negative for abdominal pain, blood in stool, constipation and diarrhea.  Endocrine: Negative for polydipsia and polyuria.  Genitourinary: Negative for dysuria, frequency and urgency.  Musculoskeletal: Negative for arthralgias, back pain and myalgias.       R shoulder pain   Skin: Negative for pallor and rash.  Allergic/Immunologic: Negative for environmental allergies.  Neurological: Negative for dizziness, syncope and headaches.  Hematological: Negative for adenopathy. Does not bruise/bleed easily.  Psychiatric/Behavioral: Negative for decreased concentration and dysphoric mood. The patient is not nervous/anxious.        Objective:   Physical Exam  Constitutional: She appears well-developed and well-nourished. No distress.  obese and well appearing    HENT:  Head: Normocephalic and atraumatic.  Mouth/Throat: Oropharynx is clear and moist.  Eyes: Pupils are equal, round, and reactive to light. Conjunctivae and EOM are normal.  Neck: Normal range of motion. Neck supple. No JVD present. Carotid bruit is not present. No thyromegaly present.  Cardiovascular: Normal rate, regular rhythm, normal heart sounds  and intact distal pulses. Exam reveals no gallop.  Pulmonary/Chest: Effort normal and breath sounds normal. No respiratory distress. She has no wheezes. She has no rales.  No crackles  Good air exch  No wheeze   Abdominal: Soft. Bowel sounds are normal. She exhibits no distension, no abdominal bruit and no mass. There is no tenderness.  Musculoskeletal: She exhibits no edema.  Lymphadenopathy:    She has no cervical adenopathy.  Neurological: She is alert. She has normal reflexes. No cranial nerve deficit. She exhibits normal  muscle tone. Coordination normal.  Skin: Skin is warm and dry. No rash noted. No pallor.  Psychiatric: She has a normal mood and affect.          Assessment & Plan:   Problem List Items Addressed This Visit      Other   Obesity (BMI 30-39.9)    Discussed how this problem influences overall health and the risks it imposes  Reviewed plan for weight loss with lower calorie diet (via better food choices and also portion control or program like weight watchers) and exercise building up to or more than 30 minutes 5 days per week including some aerobic activity   Pt lost wt and gained it back after a shoulder injury Plans on getting back on track after shoulder surgery and recovery Enc water intake and healthy diet       Preoperative examination - Primary    No restrictions for upcoming shoulder rotator cuff repair Monday  Labs pending  Clear UA  Disc drug intolerances-will disc nausea with her anesthesiologist  Has been cleared by cardiology       Relevant Orders   CBC with Differential/Platelet   Basic metabolic panel   Albumin   Hemoglobin A1c   POCT Urinalysis Dipstick (Automated) (Completed)

## 2018-03-28 LAB — CBC WITH DIFFERENTIAL/PLATELET
Basophils Absolute: 0.1 10*3/uL (ref 0.0–0.1)
Basophils Relative: 0.7 % (ref 0.0–3.0)
EOS PCT: 1.2 % (ref 0.0–5.0)
Eosinophils Absolute: 0.1 10*3/uL (ref 0.0–0.7)
HCT: 38.4 % (ref 36.0–46.0)
HEMOGLOBIN: 13 g/dL (ref 12.0–15.0)
Lymphocytes Relative: 18 % (ref 12.0–46.0)
Lymphs Abs: 1.3 10*3/uL (ref 0.7–4.0)
MCHC: 33.8 g/dL (ref 30.0–36.0)
MCV: 100.1 fl — ABNORMAL HIGH (ref 78.0–100.0)
MONO ABS: 0.7 10*3/uL (ref 0.1–1.0)
Monocytes Relative: 10 % (ref 3.0–12.0)
NEUTROS ABS: 5.2 10*3/uL (ref 1.4–7.7)
Neutrophils Relative %: 70.1 % (ref 43.0–77.0)
PLATELETS: 293 10*3/uL (ref 150.0–400.0)
RBC: 3.84 Mil/uL — ABNORMAL LOW (ref 3.87–5.11)
RDW: 13.9 % (ref 11.5–15.5)
WBC: 7.4 10*3/uL (ref 4.0–10.5)

## 2018-03-28 LAB — BASIC METABOLIC PANEL
BUN: 15 mg/dL (ref 6–23)
CO2: 33 mEq/L — ABNORMAL HIGH (ref 19–32)
Calcium: 9 mg/dL (ref 8.4–10.5)
Chloride: 105 mEq/L (ref 96–112)
Creatinine, Ser: 0.77 mg/dL (ref 0.40–1.20)
GFR: 83.77 mL/min (ref >60.00)
GLUCOSE: 73 mg/dL (ref 70–99)
Potassium: 4.2 mEq/L (ref 3.5–5.1)
SODIUM: 141 meq/L (ref 135–145)

## 2018-03-28 LAB — ALBUMIN: ALBUMIN: 3.9 g/dL (ref 3.5–5.2)

## 2018-03-28 LAB — HEMOGLOBIN A1C: Hgb A1c MFr Bld: 5.6 % (ref 4.6–6.5)

## 2018-03-29 ENCOUNTER — Other Ambulatory Visit: Payer: Self-pay

## 2018-03-29 ENCOUNTER — Encounter
Admission: RE | Admit: 2018-03-29 | Discharge: 2018-03-29 | Disposition: A | Discharge status: Home / Self Care | Payer: BC Managed Care – PPO | Source: Ambulatory Visit | Attending: Orthopedic Surgery | Admitting: Orthopedic Surgery

## 2018-03-29 HISTORY — DX: Gastro-esophageal reflux disease without esophagitis: K21.9

## 2018-03-29 HISTORY — DX: Headache, unspecified: R51.9

## 2018-03-29 HISTORY — DX: Adverse effect of unspecified anesthetic, initial encounter: T41.45XA

## 2018-03-29 HISTORY — DX: Nonspecific low blood-pressure reading: R03.1

## 2018-03-29 HISTORY — DX: Other specified postprocedural states: Z98.890

## 2018-03-29 HISTORY — DX: Palpitations: R00.2

## 2018-03-29 HISTORY — DX: Other complications of anesthesia, initial encounter: T88.59XA

## 2018-03-29 HISTORY — DX: Anemia, unspecified: D64.9

## 2018-03-29 HISTORY — DX: Other specified postprocedural states: R11.2

## 2018-03-29 HISTORY — DX: Headache: R51

## 2018-03-29 NOTE — Pre-Procedure Instructions (Signed)
Briana Iba, MD  Physician  Cardiology  Progress Notes  Signed  Encounter Date:  12/27/2017          Signed      Expand All Collapse All       Show:Clear all [x] Manual[x] Template[x] Copied  Added by: [x] Briana Iba, MD   [] Hover for details   Cardiology Office Note  Date:  12/27/2017   ID:  Briana Burnett, DOB 30-Jan-1966, MRN [id number]  PCP:  Briana Pimple, MD          Chief Complaint  Patient presents with  . Other    Overdue follow up. Patient c/o palpitations. Patient last seen 01/16/2016. Meds reviewed verbally with patient.     HPI:  [name] Is a pleasant 52 year old woman with history of  obesity,  palpitations /PVCs who presents for f/u of her shortness of breath, palpitations/tachycardia/PVCs  In follow-up today she reports having more symptomatic PVCs Seems to be more symptomatic when exercising, walking Also appreciates symptoms at rest Does not want medications   rare episode of tachycardia coming out of restroom at rest Lasted for several minutes Only had 2 episodes total over the past several years Symptomatic, rapid Resolved without intervention  No exercise program, trying to watch her diet  GI issues including diarrhea on and off for 2 y  Seen by GI, had EGD, no significant findings noted   history of palpitations dating back many years Workup in the past included a Holter monitor, echocardiogram.  Previously given propranolol, did not try this, is afraid blood pressure would drop low   November 2016 she was going to work, got very upset with her " animals" " very upset". She Developed palpitations, tachycardia , had to sit down and recover. Blood pressure was elevated , finally recovered to the point where she was able to drive to work. At work she was not thinking clearly, continue to have high blood pressure. Husband came and picked her up , went to the emergency room , was checked out and  was told that everything looked okay  EKG personally reviewed by myself on todays visit Shows normal sinus rhythm rate 71 bpm, rare PVC   PMH:   has a past medical history of Allergy, Endometriosis, and Stress incontinence.  PSH:         Past Surgical History:  Procedure Laterality Date  . ABDOMINAL HYSTERECTOMY    . BREAST EXCISIONAL BIOPSY Right    neg   . MRI  05/12/2000   brain was neg          Current Outpatient Medications  Medication Sig Dispense Refill  . pantoprazole (PROTONIX) 40 MG tablet Take 1 tablet (40 mg total) daily by mouth. 30 tablet 1   No current facility-administered medications for this visit.      Allergies:   Ibuprofen   Social History:  The patient  reports that  has never smoked. she has never used smokeless tobacco. She reports that she does not drink alcohol or use drugs.   Family History:   family history includes Alcohol abuse in her father; Breast cancer in her maternal aunt and maternal aunt; Breast cancer (age of onset: 33) in her sister; COPD in her father and mother; Cancer in her maternal aunt and maternal aunt; Cancer (age of onset: 66) in her sister; Crohn's disease in her maternal uncle; Heart disease in her father, maternal grandmother, and maternal uncle; Irritable bowel syndrome in her mother; Mental illness (age  of onset: 3152) in her father; Pulmonary embolism in her mother.    Review of Systems: Review of Systems  Constitutional: Negative.   Respiratory: Negative.   Cardiovascular: Positive for palpitations.  Gastrointestinal: Negative.   Musculoskeletal: Negative.   Neurological: Negative.   Psychiatric/Behavioral: Negative.   All other systems reviewed and are negative.    PHYSICAL EXAM: VS:  BP 116/76 (BP Location: Left Arm, Patient Position: Sitting, Cuff Size: Normal)   Pulse 71   Ht 5\' 4"  (1.626 m)   Wt 227 lb (103 kg)   BMI 38.96 kg/m  , BMI Body mass index is 38.96 kg/m. Constitutional:   oriented to person, place, and time. No distress.  obese HENT:  Head: Normocephalic and atraumatic.  Eyes:  no discharge. No scleral icterus.  Neck: Normal range of motion. Neck supple. No JVD present.  Cardiovascular: Normal rate, regular rhythm, normal heart sounds and intact distal pulses. Exam reveals no gallop and no friction rub. No edema No murmur heard. Pulmonary/Chest: Effort normal and breath sounds normal. No stridor. No respiratory distress.  no wheezes.  no rales.  no tenderness.  Abdominal: Soft.  no distension.  no tenderness.  Musculoskeletal: Normal range of motion.  no  tenderness or deformity.  Neurological:  normal muscle tone. Coordination normal. No atrophy Skin: Skin is warm and dry. No rash noted. not diaphoretic.  Psychiatric:  normal mood and affect. behavior is normal. Thought content normal.    Recent Labs: 08/22/2017: ALT 19; BUN 14; Creatinine, Ser 0.71; Hemoglobin 13.2; Platelets 272.0; Potassium 4.3; Sodium 141    Lipid Panel Recent[facility]       Lab Results  Component Value Date   CHOL 164 05/21/2015   HDL 39.30 05/21/2015   LDLCALC 109 (H) 05/21/2015   TRIG 81.0 05/21/2015           Wt Readings from Last 3 Encounters:  12/27/17 227 lb (103 kg)  10/24/17 221 lb (100.2 kg)  10/12/17 221 lb (100.2 kg)       ASSESSMENT AND PLAN:  SOB (shortness of breath) - Plan: EKG 12-Lead Recommended regular walking program PVCs not likely causing her symptoms Recommended weight loss for conditioning  Palpitations - Plan: EKG 12-Lead Secondary to PVCs as below  Frequent PVCs Long discussion concerning her symptoms She does not want beta blockers We did offer propranolol as needed, Bystolic daily She's not interested at this time  Morbid obesity (HCC) We have encouraged continued exercise, careful diet management in an effort to lose weight.  Headaches Etiology unclear, likely not from PVCs Recommend follow-up with primary  care She does have paravertebral muscle spasms, consider massage She is taking Excedrin daily. Discussed long-term consequences of aspirin  Tachycardia Concerning for short run of atrial tachycardia or SVT Rare episodes, 2 episodes total over the past several years Suggested she call us if she has more frequent episodes Suggested she try Valsalva, carotid sinus massage If symptoms persisted we will try propranolol  Disposition:   F/U  as needed    Total encounter time more than 25 minutes  Greater than 50% was spent in counseling and coordination of care with the patient       Orders Placed This Encounter  Procedures  . EKG 12-Lead     Signed, Dossie Arbourim Gollan, M.D., Ph.D. 12/27/2017  [facility], [place] [phone]           Electronically signed by Briana IbaGollan, Timothy J, MD at 12/27/2017 6:24 PM  Office Visit on 12/27/2017        Detailed Report

## 2018-03-29 NOTE — Patient Instructions (Addendum)
Your procedure is scheduled on: 04-03-18  Report to Same Day Surgery 2nd floor medical mall [facility](Medical Mall Entrance-take elevator on left to 2nd floor.  Check in with surgery information desk.) To find out your arrival time please call [phone] between 1PM - 3PM on 04-02-18   Remember: Instructions that are not followed completely may result in serious medical risk, up to and including death, or upon the discretion of your surgeon and anesthesiologist your surgery may need to be rescheduled.    _x___ 1. Do not eat food after midnight the night before your procedure. NO GUM OR CANDY AFTER MIDNIGHT.  You may drink clear liquids up to 2 hours before you are scheduled to arrive at the hospital for your procedure.  Do not drink clear liquids within 2 hours of your scheduled arrival to the hospital.  Clear liquids include  --Water or Apple juice without pulp  --Clear carbohydrate beverage such as ClearFast or Gatorade  --Black Coffee or Clear Tea (No milk, no creamers, do not add anything to the coffee or Tea     __x__ 2. No Alcohol for 24 hours before or after surgery.   __x__3. No Smoking or e-cigarettes for 24 prior to surgery.  Do not use any chewable tobacco products for at least 6 hour prior to surgery   ____  4. Bring all medications with you on the day of surgery if instructed.    __x__ 5. Notify your doctor if there is any change in your medical condition     (cold, fever, infections).    x___6. On the morning of surgery brush your teeth with toothpaste and water.  You may rinse your mouth with mouth wash if you wish.  Do not swallow any toothpaste or mouthwash.   Do not wear jewelry, make-up, hairpins, clips or nail polish.  Do not wear lotions, powders, or perfumes. You may wear deodorant.  Do not shave 48 hours prior to surgery. Men may shave face and neck.  Do not bring valuables to the hospital.    [facility] is not responsible for any belongings or valuables.      Contacts, dentures or bridgework may not be worn into surgery.  Leave your suitcase in the car. After surgery it may be brought to your room.  For patients admitted to the hospital, discharge time is determined by your treatment team.  _  Patients discharged the day of surgery will not be allowed to drive home.  You will need someone to drive you home and stay with you the night of your procedure.    Please read over the following fact sheets that you were given:   [facility] and or MRSA Information   _x___ TAKE THE FOLLOWING MEDICATION THE MORNING OF SURGERY. These include:  1. PROTONIX  2. TAKE A PROTONIX THE NIGHT BEFORE YOUR SURGERY  3.  4.  5.  6.  ____Fleets enema or Magnesium Citrate as directed.   ____ Use CHG Soap or sage wipes as directed on instruction sheet   ____ Use inhalers on the day of surgery and bring to hospital day of surgery  ____ Stop Metformin and Janumet 2 days prior to surgery.    ____ Take 1/2 of usual insulin dose the night before surgery and none on the morning  surgery.   ____ Follow recommendations from Cardiologist, Pulmonologist or PCP regarding  stopping Aspirin, Coumadin, Plavix ,Eliquis, Effient, or Pradaxa, and Pletal.  X____Stop Anti-inflammatories such as  Advil, Aleve, Ibuprofen, Motrin, Naproxen, Naprosyn, Goodies powders, MIGRAINE RELIEF NOW- or aspirin products NOW-OK to take Tylenol OR TRAMADOL    ____ Stop supplements until after surgery   ____ Bring C-Pap to the hospital.

## 2018-03-29 NOTE — Pre-Procedure Instructions (Signed)
Cardiac clearance on chart from Dr Ethelene HalGollans office

## 2018-04-02 MED ORDER — CEFAZOLIN SODIUM-DEXTROSE 2-4 GM/100ML-% IV SOLN
2.0000 g | INTRAVENOUS | Status: AC
  Administered 2018-04-03: 2 g via INTRAVENOUS

## 2018-04-03 ENCOUNTER — Ambulatory Visit: Payer: No Typology Code available for payment source | Admitting: Anesthesiology

## 2018-04-03 ENCOUNTER — Encounter
Admission: RE | Disposition: A | Discharge status: Home / Self Care | Payer: Self-pay | Source: Ambulatory Visit | Attending: Orthopedic Surgery

## 2018-04-03 ENCOUNTER — Ambulatory Visit
Admission: RE | Admit: 2018-04-03 | Discharge: 2018-04-03 | Disposition: A | Discharge status: Home / Self Care | Payer: No Typology Code available for payment source | Source: Ambulatory Visit | Attending: Orthopedic Surgery | Admitting: Orthopedic Surgery

## 2018-04-03 ENCOUNTER — Ambulatory Visit: Payer: No Typology Code available for payment source

## 2018-04-03 ENCOUNTER — Other Ambulatory Visit: Payer: Self-pay

## 2018-04-03 DIAGNOSIS — S46011A Strain of muscle(s) and tendon(s) of the rotator cuff of right shoulder, initial encounter: Secondary | ICD-10-CM | POA: Diagnosis present

## 2018-04-03 DIAGNOSIS — Z79899 Other long term (current) drug therapy: Secondary | ICD-10-CM | POA: Insufficient documentation

## 2018-04-03 DIAGNOSIS — K219 Gastro-esophageal reflux disease without esophagitis: Secondary | ICD-10-CM | POA: Diagnosis not present

## 2018-04-03 DIAGNOSIS — S46211A Strain of muscle, fascia and tendon of other parts of biceps, right arm, initial encounter: Secondary | ICD-10-CM | POA: Insufficient documentation

## 2018-04-03 DIAGNOSIS — M19011 Primary osteoarthritis, right shoulder: Secondary | ICD-10-CM | POA: Insufficient documentation

## 2018-04-03 DIAGNOSIS — Y990 Civilian activity done for income or pay: Secondary | ICD-10-CM | POA: Insufficient documentation

## 2018-04-03 DIAGNOSIS — Z886 Allergy status to analgesic agent status: Secondary | ICD-10-CM | POA: Insufficient documentation

## 2018-04-03 DIAGNOSIS — Z888 Allergy status to other drugs, medicaments and biological substances status: Secondary | ICD-10-CM | POA: Insufficient documentation

## 2018-04-03 DIAGNOSIS — M7551 Bursitis of right shoulder: Secondary | ICD-10-CM | POA: Insufficient documentation

## 2018-04-03 DIAGNOSIS — X58XXXA Exposure to other specified factors, initial encounter: Secondary | ICD-10-CM | POA: Diagnosis not present

## 2018-04-03 DIAGNOSIS — Z9889 Other specified postprocedural states: Secondary | ICD-10-CM

## 2018-04-03 HISTORY — PX: SHOULDER ARTHROSCOPY WITH ROTATOR CUFF REPAIR: SHX5685

## 2018-04-03 SURGERY — ARTHROSCOPY, SHOULDER, WITH ROTATOR CUFF REPAIR
Anesthesia: General | Site: Shoulder | Laterality: Right | Wound class: "Clean "

## 2018-04-03 MED ORDER — PROMETHAZINE HCL 25 MG/ML IJ SOLN
6.2500 mg | INTRAMUSCULAR | Status: AC | PRN
  Administered 2018-04-03 (×2): 6.25 mg via INTRAVENOUS

## 2018-04-03 MED ORDER — CEFAZOLIN SODIUM-DEXTROSE 2-4 GM/100ML-% IV SOLN
INTRAVENOUS | Status: AC
  Filled 2018-04-03: qty 100

## 2018-04-03 MED ORDER — FENTANYL CITRATE (PF) 100 MCG/2ML IJ SOLN
50.0000 ug | Freq: Once | INTRAMUSCULAR | Status: AC
  Administered 2018-04-03: 50 ug via INTRAVENOUS

## 2018-04-03 MED ORDER — MIDAZOLAM HCL 2 MG/2ML IJ SOLN
1.0000 mg | Freq: Once | INTRAMUSCULAR | Status: AC
  Administered 2018-04-03: 1 mg via INTRAVENOUS

## 2018-04-03 MED ORDER — ONDANSETRON HCL 4 MG PO TABS: 4.0000 mg | ORAL_TABLET | Freq: Four times a day (QID) | ORAL | Status: DC | PRN

## 2018-04-03 MED ORDER — HYDROCODONE-ACETAMINOPHEN 5-325 MG PO TABS: 1.0000 | ORAL_TABLET | ORAL | Status: DC | PRN

## 2018-04-03 MED ORDER — FENTANYL CITRATE (PF) 100 MCG/2ML IJ SOLN
INTRAMUSCULAR | Status: AC
  Filled 2018-04-03: qty 2

## 2018-04-03 MED ORDER — SUGAMMADEX SODIUM 200 MG/2ML IV SOLN
INTRAVENOUS | Status: DC | PRN
  Administered 2018-04-03: 200 mg via INTRAVENOUS

## 2018-04-03 MED ORDER — METOPROLOL TARTRATE 5 MG/5ML IV SOLN
INTRAVENOUS | Status: AC
  Administered 2018-04-03: 3 mg via INTRAVENOUS
  Filled 2018-04-03: qty 5

## 2018-04-03 MED ORDER — BUPIVACAINE HCL (PF) 0.5 % IJ SOLN
INTRAMUSCULAR | Status: AC
  Filled 2018-04-03: qty 10

## 2018-04-03 MED ORDER — TRAMADOL HCL 50 MG PO TABS: 50.0000 mg | ORAL_TABLET | Freq: Four times a day (QID) | ORAL | Status: DC

## 2018-04-03 MED ORDER — BUPIVACAINE LIPOSOME 1.3 % IJ SUSP
INTRAMUSCULAR | Status: AC
  Filled 2018-04-03: qty 20

## 2018-04-03 MED ORDER — DEXAMETHASONE SODIUM PHOSPHATE 10 MG/ML IJ SOLN
INTRAMUSCULAR | Status: AC
  Filled 2018-04-03: qty 1

## 2018-04-03 MED ORDER — FENTANYL CITRATE (PF) 100 MCG/2ML IJ SOLN
INTRAMUSCULAR | Status: DC | PRN
  Administered 2018-04-03: 100 ug via INTRAVENOUS

## 2018-04-03 MED ORDER — SODIUM CHLORIDE 0.9 % IJ SOLN
INTRAMUSCULAR | Status: AC
  Filled 2018-04-03: qty 50

## 2018-04-03 MED ORDER — SODIUM CHLORIDE FLUSH 0.9 % IV SOLN
INTRAVENOUS | Status: AC
  Filled 2018-04-03: qty 10

## 2018-04-03 MED ORDER — PROPOFOL 10 MG/ML IV BOLUS
INTRAVENOUS | Status: DC | PRN
  Administered 2018-04-03: 160 mg via INTRAVENOUS

## 2018-04-03 MED ORDER — LACTATED RINGERS IV SOLN: INTRAVENOUS | Status: DC

## 2018-04-03 MED ORDER — BUPIVACAINE-EPINEPHRINE (PF) 0.25% -1:200000 IJ SOLN
INTRAMUSCULAR | Status: DC | PRN
  Administered 2018-04-03: 10 mL

## 2018-04-03 MED ORDER — SODIUM CHLORIDE 0.9 % IV SOLN
INTRAVENOUS | Status: DC | PRN
  Administered 2018-04-03: 30 ug/min via INTRAVENOUS

## 2018-04-03 MED ORDER — METOPROLOL TARTRATE 5 MG/5ML IV SOLN
3.0000 mg | Freq: Once | INTRAVENOUS | Status: AC
  Administered 2018-04-03: 3 mg via INTRAVENOUS

## 2018-04-03 MED ORDER — PROPOFOL 10 MG/ML IV BOLUS
INTRAVENOUS | Status: AC
  Filled 2018-04-03: qty 20

## 2018-04-03 MED ORDER — PHENYLEPHRINE HCL 10 MG/ML IJ SOLN
INTRAMUSCULAR | Status: AC
  Filled 2018-04-03: qty 1

## 2018-04-03 MED ORDER — DOCUSATE SODIUM 100 MG PO CAPS: 100.0000 mg | ORAL_CAPSULE | Freq: Every day | ORAL | 2 refills | Status: DC | PRN

## 2018-04-03 MED ORDER — DEXAMETHASONE SODIUM PHOSPHATE 10 MG/ML IJ SOLN
INTRAMUSCULAR | Status: DC | PRN
  Administered 2018-04-03: 8 mg via INTRAVENOUS

## 2018-04-03 MED ORDER — LIDOCAINE HCL (CARDIAC) PF 100 MG/5ML IV SOSY
PREFILLED_SYRINGE | INTRAVENOUS | Status: DC | PRN
  Administered 2018-04-03: 100 mg via INTRAVENOUS

## 2018-04-03 MED ORDER — ONDANSETRON HCL 4 MG/2ML IJ SOLN
INTRAMUSCULAR | Status: AC
  Filled 2018-04-03: qty 2

## 2018-04-03 MED ORDER — MENTHOL 3 MG MT LOZG
1.0000 | LOZENGE | OROMUCOSAL | Status: DC | PRN
  Filled 2018-04-03: qty 9

## 2018-04-03 MED ORDER — ROPIVACAINE HCL 5 MG/ML IJ SOLN
INTRAMUSCULAR | Status: AC
  Filled 2018-04-03: qty 30

## 2018-04-03 MED ORDER — HYDROCODONE-ACETAMINOPHEN 5-325 MG PO TABS: 1.0000 | ORAL_TABLET | ORAL | 0 refills | Status: AC | PRN

## 2018-04-03 MED ORDER — FENTANYL CITRATE (PF) 100 MCG/2ML IJ SOLN
INTRAMUSCULAR | Status: AC
  Administered 2018-04-03: 50 ug via INTRAVENOUS
  Filled 2018-04-03: qty 2

## 2018-04-03 MED ORDER — MIDAZOLAM HCL 2 MG/2ML IJ SOLN
INTRAMUSCULAR | Status: AC
  Administered 2018-04-03: 1 mg via INTRAVENOUS
  Filled 2018-04-03: qty 2

## 2018-04-03 MED ORDER — ROCURONIUM BROMIDE 100 MG/10ML IV SOLN
INTRAVENOUS | Status: DC | PRN
  Administered 2018-04-03: 40 mg via INTRAVENOUS
  Administered 2018-04-03: 5 mg via INTRAVENOUS
  Administered 2018-04-03: 10 mg via INTRAVENOUS

## 2018-04-03 MED ORDER — EPINEPHRINE 30 MG/30ML IJ SOLN
INTRAMUSCULAR | Status: AC
  Filled 2018-04-03: qty 1

## 2018-04-03 MED ORDER — LACTATED RINGERS IV SOLN
INTRAVENOUS | Status: DC
  Administered 2018-04-03: 1000 mL via INTRAVENOUS

## 2018-04-03 MED ORDER — ONDANSETRON HCL 4 MG/2ML IJ SOLN
4.0000 mg | Freq: Once | INTRAMUSCULAR | Status: AC | PRN
  Administered 2018-04-03: 4 mg via INTRAVENOUS

## 2018-04-03 MED ORDER — SUGAMMADEX SODIUM 200 MG/2ML IV SOLN
INTRAVENOUS | Status: AC
  Filled 2018-04-03: qty 2

## 2018-04-03 MED ORDER — METOCLOPRAMIDE HCL 5 MG/ML IJ SOLN: 5.0000 mg | Freq: Three times a day (TID) | INTRAMUSCULAR | Status: DC | PRN

## 2018-04-03 MED ORDER — ROCURONIUM BROMIDE 50 MG/5ML IV SOLN
INTRAVENOUS | Status: AC
  Filled 2018-04-03: qty 1

## 2018-04-03 MED ORDER — DOCUSATE SODIUM 100 MG PO CAPS: 100.0000 mg | ORAL_CAPSULE | Freq: Two times a day (BID) | ORAL | Status: DC

## 2018-04-03 MED ORDER — ACETAMINOPHEN 325 MG PO TABS: 325.0000 mg | ORAL_TABLET | Freq: Four times a day (QID) | ORAL | Status: DC | PRN

## 2018-04-03 MED ORDER — ONDANSETRON HCL 4 MG/2ML IJ SOLN: 4.0000 mg | Freq: Four times a day (QID) | INTRAMUSCULAR | Status: DC | PRN

## 2018-04-03 MED ORDER — METOCLOPRAMIDE HCL 10 MG PO TABS: 5.0000 mg | ORAL_TABLET | Freq: Three times a day (TID) | ORAL | Status: DC | PRN

## 2018-04-03 MED ORDER — FENTANYL CITRATE (PF) 100 MCG/2ML IJ SOLN: 25.0000 ug | INTRAMUSCULAR | Status: DC | PRN

## 2018-04-03 MED ORDER — BUPIVACAINE-EPINEPHRINE (PF) 0.25% -1:200000 IJ SOLN
INTRAMUSCULAR | Status: AC
  Filled 2018-04-03: qty 30

## 2018-04-03 MED ORDER — SUCCINYLCHOLINE CHLORIDE 20 MG/ML IJ SOLN
INTRAMUSCULAR | Status: DC | PRN
  Administered 2018-04-03: 100 mg via INTRAVENOUS

## 2018-04-03 MED ORDER — LIDOCAINE HCL (PF) 2 % IJ SOLN
INTRAMUSCULAR | Status: AC
  Filled 2018-04-03: qty 10

## 2018-04-03 MED ORDER — ONDANSETRON HCL 4 MG/2ML IJ SOLN
INTRAMUSCULAR | Status: DC | PRN
Start: 2018-04-03 — End: 2018-04-03
  Administered 2018-04-03: 4 mg via INTRAVENOUS

## 2018-04-03 MED ORDER — PHENOL 1.4 % MT LIQD
1.0000 | OROMUCOSAL | Status: DC | PRN
  Filled 2018-04-03: qty 177

## 2018-04-03 MED ORDER — CHLORHEXIDINE GLUCONATE 4 % EX LIQD: 60.0000 mL | Freq: Once | CUTANEOUS | Status: DC

## 2018-04-03 MED ORDER — EPINEPHRINE 30 MG/30ML IJ SOLN
INTRAMUSCULAR | Status: DC | PRN
  Administered 2018-04-03: 4 mL

## 2018-04-03 MED ORDER — HYDROCODONE-ACETAMINOPHEN 7.5-325 MG PO TABS: 1.0000 | ORAL_TABLET | ORAL | Status: DC | PRN

## 2018-04-03 MED ORDER — SUCCINYLCHOLINE CHLORIDE 20 MG/ML IJ SOLN
INTRAMUSCULAR | Status: AC
  Filled 2018-04-03: qty 1

## 2018-04-03 MED ORDER — PROMETHAZINE HCL 25 MG/ML IJ SOLN
INTRAMUSCULAR | Status: AC
  Administered 2018-04-03: 6.25 mg via INTRAVENOUS
  Filled 2018-04-03: qty 1

## 2018-04-03 MED ORDER — LIDOCAINE HCL (PF) 1 % IJ SOLN
INTRAMUSCULAR | Status: AC
  Filled 2018-04-03: qty 5

## 2018-04-03 MED ORDER — PHENYLEPHRINE HCL 10 MG/ML IJ SOLN
INTRAMUSCULAR | Status: DC | PRN
  Administered 2018-04-03 (×3): 100 ug via INTRAVENOUS

## 2018-04-03 MED ORDER — MORPHINE SULFATE (PF) 4 MG/ML IV SOLN: 0.5000 mg | INTRAVENOUS | Status: DC | PRN

## 2018-04-03 MED ORDER — KETOROLAC TROMETHAMINE 15 MG/ML IJ SOLN: 15.0000 mg | Freq: Four times a day (QID) | INTRAMUSCULAR | Status: DC

## 2018-04-03 SURGICAL SUPPLY — 66 items
ADAPTER IRRIG TUBE 2 SPIKE SOL (ADAPTER) ×6 IMPLANT
ANCHOR SUT BIO SW 4.75X19.1 (Anchor) ×4 IMPLANT
BLADE FULL RADIUS 3.5 (BLADE) ×3 IMPLANT
BLADE INCISOR PLUS 4.5 (BLADE) ×3 IMPLANT
BLADE SURG MINI STRL (BLADE) ×3 IMPLANT
BRUSH SCRUB EZ  4% CHG (MISCELLANEOUS) ×2
BRUSH SCRUB EZ 4% CHG (MISCELLANEOUS) ×1 IMPLANT
BUR BR 5.5 WIDE MOUTH (BURR) ×2 IMPLANT
CANNULA SHOULDER 7CM (CANNULA) ×3 IMPLANT
CANNULA TWIST IN 8.25X7CM (CANNULA) ×3 IMPLANT
CHLORAPREP W/TINT 26ML (MISCELLANEOUS) ×5 IMPLANT
CLOSURE WOUND 1/4X4 (GAUZE/BANDAGES/DRESSINGS)
COOLER POLAR GLACIER W/PUMP (MISCELLANEOUS) ×1 IMPLANT
CRADLE LAMINECT ARM (MISCELLANEOUS) ×3 IMPLANT
DEVICE SUCT BLK HOLE OR FLOOR (MISCELLANEOUS) ×1 IMPLANT
DRAPE IMP U-DRAPE 54X76 (DRAPES) ×6 IMPLANT
DRAPE INCISE IOBAN 66X45 STRL (DRAPES) ×3 IMPLANT
DRAPE SHEET LG 3/4 BI-LAMINATE (DRAPES) ×3 IMPLANT
DRAPE STERI 35X30 U-POUCH (DRAPES) ×3 IMPLANT
DRAPE U-SHAPE 47X51 STRL (DRAPES) ×3 IMPLANT
ELECT REM PT RETURN 9FT ADLT (ELECTROSURGICAL) ×3
ELECTRODE REM PT RTRN 9FT ADLT (ELECTROSURGICAL) ×1 IMPLANT
GAUZE PETRO XEROFOAM 1X8 (MISCELLANEOUS) ×3 IMPLANT
GAUZE SPONGE 4X4 12PLY STRL (GAUZE/BANDAGES/DRESSINGS) ×3 IMPLANT
GLOVE BIOGEL PI IND STRL 8 (GLOVE) ×1 IMPLANT
GLOVE BIOGEL PI INDICATOR 8 (GLOVE) ×2
GLOVE SURG ORTHO 8.0 STRL STRW (GLOVE) ×9 IMPLANT
GOWN STRL REUS W/ TWL LRG LVL3 (GOWN DISPOSABLE) ×1 IMPLANT
GOWN STRL REUS W/ TWL XL LVL3 (GOWN DISPOSABLE) ×1 IMPLANT
GOWN STRL REUS W/TWL LRG LVL3 (GOWN DISPOSABLE) ×6
GOWN STRL REUS W/TWL XL LVL3 (GOWN DISPOSABLE) ×2
IV LACTATED RINGER IRRG 3000ML (IV SOLUTION) ×8
IV LR IRRIG 3000ML ARTHROMATIC (IV SOLUTION) ×6 IMPLANT
KIT STABILIZATION SHOULDER (MISCELLANEOUS) ×3 IMPLANT
KIT TURNOVER KIT A (KITS) ×3 IMPLANT
MANIFOLD NEPTUNE II (INSTRUMENTS) ×6 IMPLANT
MASK FACE SPIDER DISP (MASK) ×3 IMPLANT
MAT BLUE FLOOR 46X72 FLO (MISCELLANEOUS) ×3 IMPLANT
NDL SAFETY ECLIPSE 18X1.5 (NEEDLE) ×1 IMPLANT
NDL SCORPION MULTI FIRE (NEEDLE) IMPLANT
NDL SPNL 18GX3.5 QUINCKE PK (NEEDLE) ×1 IMPLANT
NEEDLE HYPO 18GX1.5 SHARP (NEEDLE) ×2
NEEDLE HYPO 22GX1.5 SAFETY (NEEDLE) ×3 IMPLANT
NEEDLE SCORPION MULTI FIRE (NEEDLE) ×3 IMPLANT
NEEDLE SPNL 18GX3.5 QUINCKE PK (NEEDLE) ×3 IMPLANT
PACK ARTHROSCOPY SHOULDER (MISCELLANEOUS) ×3 IMPLANT
PAD ABD DERMACEA PRESS 5X9 (GAUZE/BANDAGES/DRESSINGS) IMPLANT
PAD WRAPON POLAR SHDR XLG (MISCELLANEOUS) ×1 IMPLANT
SLING ARM LRG DEEP (SOFTGOODS) IMPLANT
SLING ULTRA II LG (MISCELLANEOUS) ×2 IMPLANT
SPONGE XRAY 4X4 16PLY STRL (MISCELLANEOUS) IMPLANT
STRAP SAFETY 5IN WIDE (MISCELLANEOUS) ×3 IMPLANT
STRIP CLOSURE SKIN 1/4X4 (GAUZE/BANDAGES/DRESSINGS) IMPLANT
SUT ETHILON NAB PS2 4-0 18IN (SUTURE) ×3 IMPLANT
SUT FIBERWIRE #2 38 T-5 BLUE (SUTURE)
SUT PDS AB 0 CT1 27 (SUTURE) ×3 IMPLANT
SUT TIGER TAPE 7 IN WHITE (SUTURE) ×2 IMPLANT
SUTURE FIBERWR #2 38 T-5 BLUE (SUTURE) IMPLANT
SYR 10ML LL (SYRINGE) ×3 IMPLANT
SYR 50ML LL SCALE MARK (SYRINGE) ×3 IMPLANT
TAPE MICROFOAM 4IN (TAPE) ×3 IMPLANT
TUBING ARTHRO INFLOW-ONLY STRL (TUBING) ×3 IMPLANT
TUBING CONNECTING 10 (TUBING) ×2 IMPLANT
TUBING CONNECTING 10' (TUBING) ×1
WAND HAND CNTRL MULTIVAC 90 (MISCELLANEOUS) ×3 IMPLANT
WRAPON POLAR PAD SHDR XLG (MISCELLANEOUS)

## 2018-04-03 NOTE — Transfer of Care (Signed)
Immediate Anesthesia Transfer of Care Note  Patient: Briana Burnett  Procedure(s) Performed: Right SHOULDER ARTHROSCOPY WITH ROTATOR CUFF REPAIR, distal clavicle excision, decompression and biceps tenotomy (Right Shoulder)  Patient Location: PACU  Anesthesia Type:General  Level of Consciousness: awake, alert  and responds to stimulation  Airway & Oxygen Therapy: Patient Spontanous Breathing and Patient connected to face mask oxygen  Post-op Assessment: Report given to RN and Post -op Vital signs reviewed and stable  Post vital signs: Reviewed and stable  Last Vitals:  Vitals Value Taken Time  BP 128/66 04/03/2018 10:04 AM  Temp 35.9 C 04/03/2018 10:01 AM  Pulse 98 04/03/2018 10:04 AM  Resp 22 04/03/2018 10:04 AM  SpO2 99 % 04/03/2018 10:04 AM    Last Pain:  Vitals:   04/03/18 0745  TempSrc:   PainSc: 0-No pain         Complications: No apparent anesthesia complications

## 2018-04-03 NOTE — Anesthesia Postprocedure Evaluation (Signed)
Anesthesia Post Note  Patient: GAILA ENGEBRETSEN  Procedure(s) Performed: Right SHOULDER ARTHROSCOPY WITH ROTATOR CUFF REPAIR, distal clavicle excision, decompression and biceps tenotomy (Right Shoulder)  Patient location during evaluation: PACU Anesthesia Type: General Level of consciousness: awake and alert and oriented Pain management: pain level controlled Vital Signs Assessment: post-procedure vital signs reviewed and stable Respiratory status: spontaneous breathing Cardiovascular status: blood pressure returned to baseline Anesthetic complications: no Comments: Patient tolerated the procedure well.  Some postop nausea made better with antiemetics.  Post op chest film with only some right-sided atelectasis which improved with deep breathing.  Patient required minimal pain meds.  Patient moving fingers at end of the case in PACU.     Last Vitals:  Vitals:   04/03/18 1146 04/03/18 1210  BP: (!) 111/59 (!) 114/96  Pulse:  67  Resp:  16  Temp: (!) 36.1 C (!) 36 C  SpO2:  98%    Last Pain:  Vitals:   04/03/18 1210  TempSrc: Temporal  PainSc: 3                  Wayden Schwertner

## 2018-04-03 NOTE — Discharge Instructions (Addendum)
Interscalene Nerve Block with Exparel  1.  For your surgery you have received an Interscalene Nerve Block with Exparel. 2. Nerve Blocks affect many types of nerves, including nerves that control movement, pain and normal sensation.  You may experience feelings such as numbness, tingling, heaviness, weakness or the inability to move your arm or the feeling or sensation that your arm has "fallen asleep". 3. A nerve block with Exparel can last up to 5 days.  Usually the weakness wears off first.  The tingling and heaviness usually wear off next.  Finally you may start to notice pain.  Keep in mind that this may occur in any order.  Once a nerve block starts to wear off it is usually completely gone within 60 minutes. 4. ISNB may cause mild shortness of breath, a hoarse voice, blurry vision, unequal pupils, or drooping of the face on the same side as the nerve block.  These symptoms will usually resolve with the numbness.  Very rarely the procedure itself can cause mild seizures. 5. If needed, your surgeon will give you a prescription for pain medication.  It will take about 60 minutes for the oral pain medication to become fully effective.  So, it is recommended that you start taking this medication before the nerve block first begins to wear off, or when you first begin to feel discomfort. 6. Take your pain medication only as prescribed.  Pain medication can cause sedation and decrease your breathing if you take more than you need for the level of pain that you have. 7. Nausea is a common side effect of many pain medications.  You may want to eat something before taking your pain medicine to prevent nausea. 8. After an Interscalene nerve block, you cannot feel pain, pressure or extremes in temperature in the effected arm.  Because your arm is numb it is at an increased risk for injury.  To decrease the possibility of injury, please practice the following:  a. While you are awake change the position of  your arm frequently to prevent too much pressure on any one area for prolonged periods of time. b.  If you have a cast or tight dressing, check the color or your fingers every couple of hours.  Call your surgeon with the appearance of any discoloration (white or blue). c. If you are given a sling to wear before you go home, please wear it  at all times until the block has completely worn off.  Do not get up at night without your sling. d. Please contact [facility] or your surgeon if you do not begin to regain sensation after 7 days from the surgery.  Anesthesia may be contacted by calling the Same Day Surgery Department, Mon. through Fri., 6 am to 4 pm at [phone].   e. If you experience any other problems or concerns, please contact your surgeon's office. f. If you experience severe or prolonged shortness of breath go to the nearest emergency department.   Wear sling at all times, including sleep.  You will need to use the sling for a total of 4 weeks following surgery.  Do not try and lift your arm up or away from your body for any reason.   Keep the dressing dry.  You may remove bandage in 3 days.  You may place Band-Aids over top of the incisions.  May shower once dressing is removed in 3 days.  Remove sling carefully only for showers, leaving arm down by your side  while in the shower.  +++ Make sure to take some pain medication this evening before you fall asleep, in preparation for the nerve block wearing off in the middle of the night.  If the the pain medication causes itching, or is too strong, try taking a single tablet at a time, or combining with Benadryl.  You may be most comfortable sleeping in a recliner.  If you do sleep in near bed, placed pillows behind the shoulder that have the operation to support it.    AMBULATORY SURGERY  DISCHARGE INSTRUCTIONS   1) The drugs that you were given will stay in your system until tomorrow so for the next 24 hours you should  not:  A) Drive an automobile B) Make any legal decisions C) Drink any alcoholic beverage   2) You may resume regular meals tomorrow.  Today it is better to start with liquids and gradually work up to solid foods.  You may eat anything you prefer, but it is better to start with liquids, then soup and crackers, and gradually work up to solid foods.   3) Please notify your doctor immediately if you have any unusual bleeding, trouble breathing, redness and pain at the surgery site, drainage, fever, or pain not relieved by medication.    4) Additional Instructions:  Please contact your physician with any problems or Same Day Surgery at [phone], Monday through Friday 6 am to 4 pm, or [place] at [facility] number at [phone].

## 2018-04-03 NOTE — Op Note (Signed)
04/03/2018  12:08 PM  PATIENT:  Briana Burnett  52 y.o. 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    The risks benefits and alternatives were discussed with the patient preoperatively including but not limited to the risks of infection, bleeding, nerve injury, persistent pain or weakness, failure of the hardware, re-tear of the rotator cuff and the need for further surgery. Medical risks include DVT and pulmonary embolism, myocardial infarction, stroke, pneumonia, respiratory failure and death. Patient understood these risks and wished to proceed.  OPERATIVE IMPLANTS: Ar[employer] SpeedBridge System  OPERATIVE PROCEDURE: The patient was met in the preoperative area. The right shoulder was signed with my initials according the hospital's correct site of surgery protocol. The patient is brought to the OR and underwent a supraclavicular block and general endotracheal intubation by the anesthesia service.  The patient was placed in a beachchair position. A spider arm positioner was used for this case. Examination under anesthesia revealed negative sulcus sign and negative load-shift test.  The patient was prepped and draped in a sterile fashion. A timeout was performed to verify the patient's name, date of birth, medical record number, correct site of surgery and correct procedure  to be performed there was also used to verify the patient received antibiotics that all appropriate instruments, implants and radiographs studies were available in the room. Once all in attendance were in agreement case began.  Bony landmarks were drawn out with a surgical marker along with proposed arthroscopy incisions. These were pre-injected with 1% lidocaine plain. An 11 blade was used to establish a posterior portal through which the arthroscope was placed in the glenohumeral joint. A full diagnostic examination of the shoulder was performed. The anterior portal was established under direct visualization with an 18-gauge spinal needle.  A 5.75 mm arthroscopic cannula was placed through the anterior portal.   The intra-articular portion of the biceps tendon was found to have a partial tear involving greater than 50% of the diameter. Therefore the decision was made to perform a tenotomy. An arthroscopic wand was used to release the biceps tendon off the superior labrum. The arthroscopic shaver was then used to debride the frayed edges of the labrum. There were no anterior or superior labral tears seen.  Subscapularis tendon was intact. Patient had a near full-thickness tear involving the supraspinatus with mild retraction. There were no loose bodies within the inferior recess and no evidence of HAGL lesion.  The arthroscope was then placed in the subacromial space. A lateral portal was then established using an 18-gauge spinal needle for localization.   The greater tuberosity was debrided using a 5.5 mm resector shaver blade to remove all remaining foreign fibers of the rotator cuff.  Debridement was performed until punctate bleeding was seen at the greater tuberosity footprint, which will allow for rotator cuff healing.  Extensive bursitis was encountered and debrided using a 4-0 resector shaver blade and a 90 ArthroCare wand  from the lateral portal. The cuff was mobilized and the tape passed through  the rotator cuff in horizontal fashion. The tape was then fixated on the lateral side with one SwiveLock anchor. The final construct was stable and moved as a unit with excellent coverage of the humeral head.  All incisions were copiously irrigated. Skin closure for the arthroscopic incisions was performed with 4-0 nylon.  A dry sterile dressing including Steri-Strips was applied . The patient was placed in an abduction sling.  All sharp and instrument counts were correct at the conclusion of the case. I was scrubbed and present for the entire case. I spoke with the patient's family in the post-op consultation room and informed them that the case had been performed without complication and the patient was stable in recovery room.   Kurtis Bushman, MD

## 2018-04-03 NOTE — Anesthesia Procedure Notes (Signed)
Procedure Name: Intubation Performed by: Floris Neuhaus, CRNA Pre-anesthesia Checklist: Patient identified, Patient being monitored, Timeout performed, Emergency Drugs available and Suction available Patient Re-evaluated:Patient Re-evaluated prior to induction Oxygen Delivery Method: Circle system utilized Preoxygenation: Pre-oxygenation with 100% oxygen Induction Type: IV induction Ventilation: Mask ventilation without difficulty Laryngoscope Size: Mac and 3 Grade View: Grade I Tube type: Oral Tube size: 7.0 mm Number of attempts: 1 Airway Equipment and Method: Stylet Placement Confirmation: ETT inserted through vocal cords under direct vision,  positive ETCO2 and breath sounds checked- equal and bilateral Secured at: 20 cm Tube secured with: Tape Dental Injury: Teeth and Oropharynx as per pre-operative assessment        

## 2018-04-03 NOTE — Anesthesia Procedure Notes (Signed)
Anesthesia Regional Block: Interscalene brachial plexus block   Pre-Anesthetic Checklist: ,, timeout performed, Correct Patient, Correct Site, Correct Laterality, Correct Procedure, Correct Position, site marked, Risks and benefits discussed,  Surgical consent,  Pre-op evaluation,  At surgeon's request and post-op pain management  Laterality: Right  Prep: chloraprep, alcohol swabs       Needles:  Injection technique: Single-shot  Needle Type: Stimiplex     Needle Length: 5cm  Needle Gauge: 21     Additional Needles:   Procedures:, nerve stimulator,,, ultrasound used (permanent image in chart),,,,   Nerve Stimulator or Paresthesia:  Response: biceps flexion, 0.6 mA,   Additional Responses:   Narrative:  Start time: 04/03/2018 7:25 AM End time: 04/03/2018 7:35 AM Injection made incrementally with aspirations every 5 mL.  Performed by: Personally   Additional Notes: Time out called.  The patient was placed in a semi-sitting position.  The right interscalene bundle was visualized and marked at the C6 position.  The area was then prepped and draped in sterile fashion.  A skin wheal was made lateral to our mark with 1% Lidocaine plain with several sticks to get a wide enough patch.  The stimuplex needle was then introduced X 1 and guided toward that area with the return of a biceps twitch and fade at about 0.6 mAmps.  A mixture of 10 cc of 0.5% marcaine plain and 6 cc of Exparel was then injected with no pain on injection and easy injection without resistance.  The patient tolerated the procedure well.

## 2018-04-03 NOTE — Anesthesia Post-op Follow-up Note (Signed)
Anesthesia QCDR form completed.        

## 2018-04-03 NOTE — Anesthesia Preprocedure Evaluation (Addendum)
Anesthesia Evaluation  Patient identified by MRN, date of birth, ID band Patient awake    Reviewed: Allergy & Precautions, NPO status , Patient's Chart, lab work & pertinent test results  History of Anesthesia Complications (+) PONV and history of anesthetic complications  Airway Mallampati: III  TM Distance: >3 FB     Dental   Pulmonary shortness of breath and with exertion,    Pulmonary exam normal        Cardiovascular negative cardio ROS Normal cardiovascular exam+ dysrhythmias      Neuro/Psych  Headaches, negative psych ROS   GI/Hepatic Neg liver ROS, GERD  ,  Endo/Other  negative endocrine ROS  Renal/GU negative Renal ROS Bladder dysfunction      Musculoskeletal  (+) Arthritis , Osteoarthritis,    Abdominal Normal abdominal exam  (+)   Peds negative pediatric ROS (+)  Hematology  (+) anemia ,   Anesthesia Other Findings Past Medical History: No date: Allergy No date: Anemia     Comment:  h/o No date: Complication of anesthesia No date: Endometriosis No date: GERD (gastroesophageal reflux disease) No date: Headache No date: Heart palpitations     Comment:  has seen dr Mariah Milling No date: Low blood pressure, not hypotension No date: PONV (postoperative nausea and vomiting)     Comment:  nasuea No date: Stress incontinence  Hx of PVCs in the past...seen by Dr. Mariah Milling   Reproductive/Obstetrics                            Anesthesia Physical Anesthesia Plan  ASA: II  Anesthesia Plan: General   Post-op Pain Management:  Regional for Post-op pain   Induction: Intravenous  PONV Risk Score and Plan:   Airway Management Planned: Oral ETT  Additional Equipment:   Intra-op Plan:   Post-operative Plan: Extubation in OR  Informed Consent: I have reviewed the patients History and Physical, chart, labs and discussed the procedure including the risks, benefits and alternatives  for the proposed anesthesia with the patient or authorized representative who has indicated his/her understanding and acceptance.   Dental advisory given  Plan Discussed with: Surgeon and CRNA  Anesthesia Plan Comments: (Discussed the interscalene block in detail including potential risks including nerve damage, pneumothorax, infection, drug reaction and phrenic nerve numbness among others.  The patient appears to understand and agrees to the block.)       Anesthesia Quick Evaluation

## 2018-04-03 NOTE — Progress Notes (Signed)
Charge nurse informed Dr. Noralyn Pick of patients PVC couplets in PACU on arrival. Dr. Noralyn Pick at bedside.

## 2018-04-03 NOTE — H&P (Signed)
The patient has been re-examined, and the chart reviewed, and there have been no interval changes to the documented history and physical.  Plan a right shoulder arthroscopy today.  Anesthesia is consulted regarding a peripheral nerve block for post-operative pain.  The risks, benefits, and alternatives have been discussed at length, and the patient is willing to proceed.    

## 2018-05-06 ENCOUNTER — Ambulatory Visit: Payer: Self-pay | Admitting: Family Medicine

## 2018-05-09 ENCOUNTER — Other Ambulatory Visit: Payer: Self-pay | Admitting: Gastroenterology

## 2018-07-04 ENCOUNTER — Other Ambulatory Visit: Payer: Self-pay | Admitting: Gastroenterology

## 2018-09-22 IMAGING — MG MM DIGITAL DIAGNOSTIC BILAT W/ TOMO W/ CAD
8 of 15 series · 8 of 35 positions shown · non-contrast
Comparison: Previous exam(s).

ACR Breast Density Category a: The breast tissue is almost entirely
fatty.

CLINICAL DATA: Patient noted possible nodularity with tenderness
within the inferior right breast.

EXAM:
2D DIGITAL DIAGNOSTIC BILATERAL MAMMOGRAM WITH CAD AND ADJUNCT TOMO
ULTRASOUND RIGHT BREAST

[R MLO]
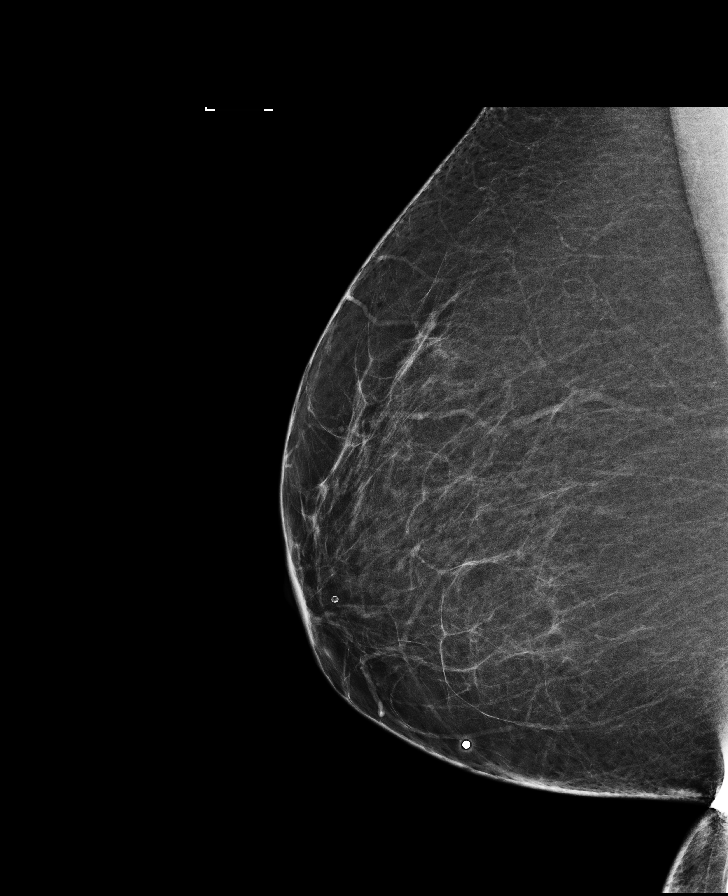

[L MLO]
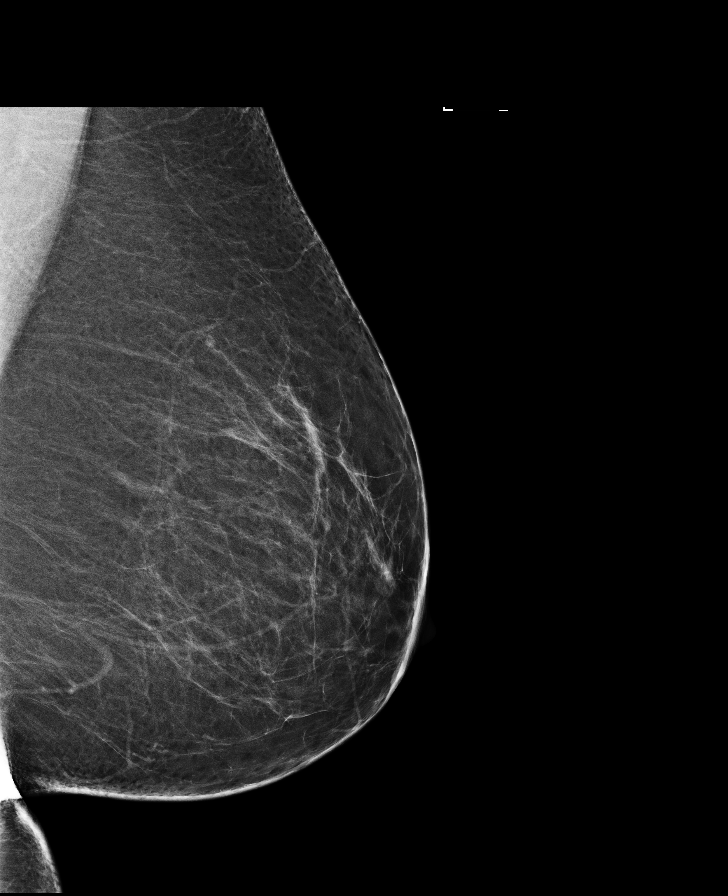

[R ML]
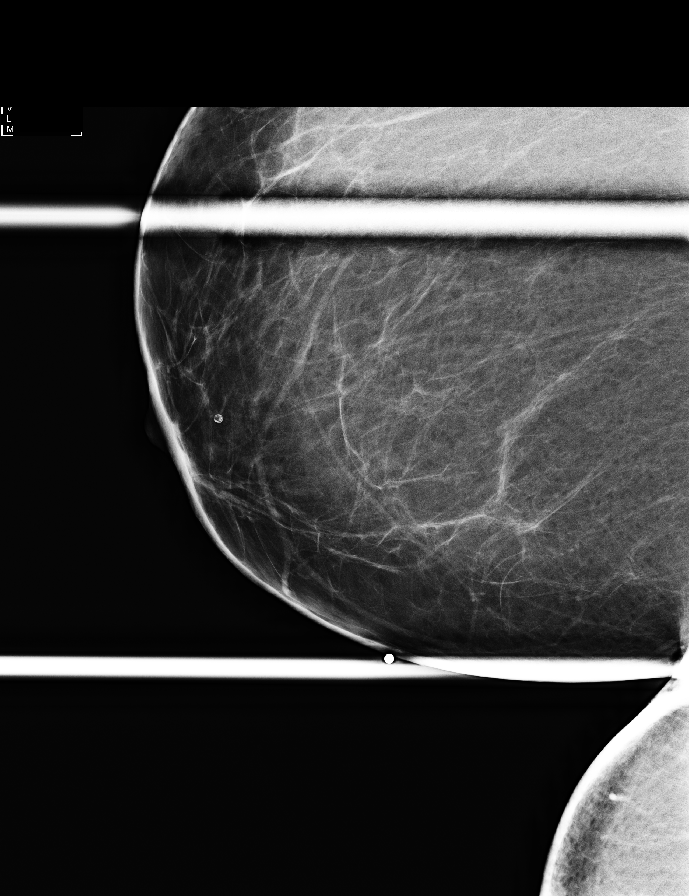

[L MLO synth-2D]
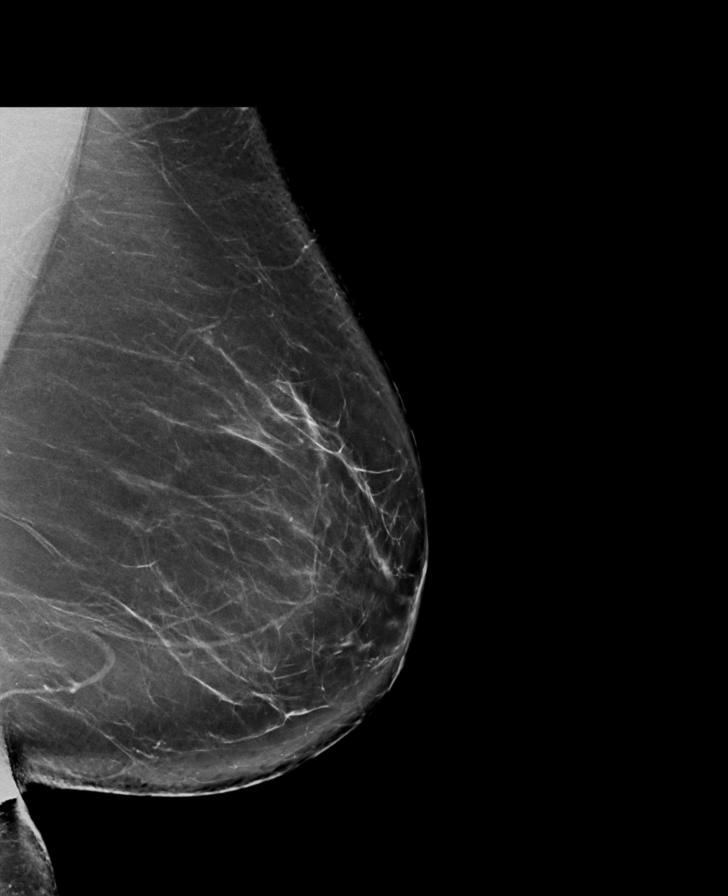

[R CC]
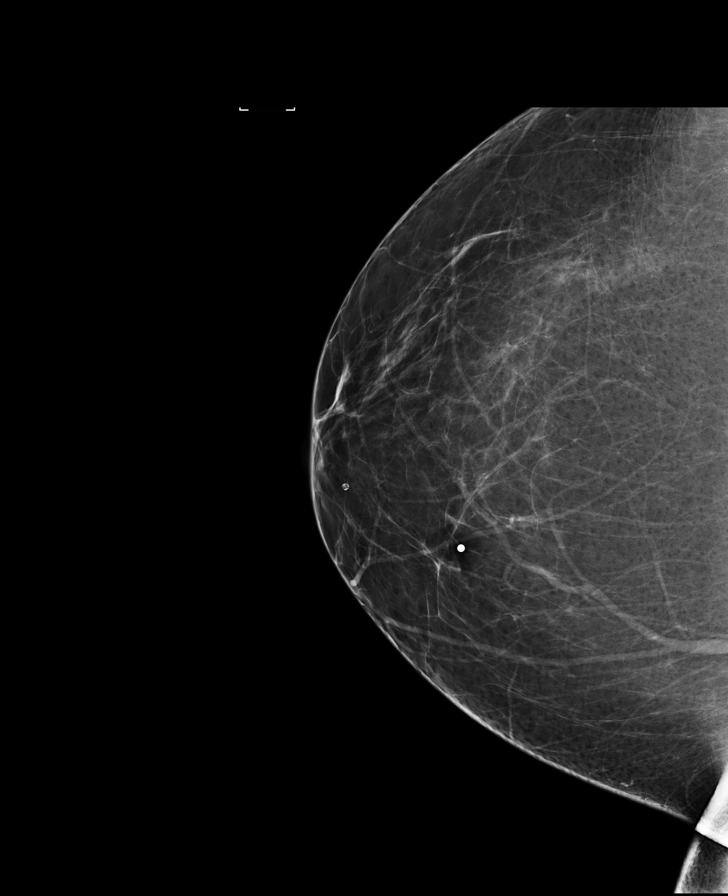

[L CC]
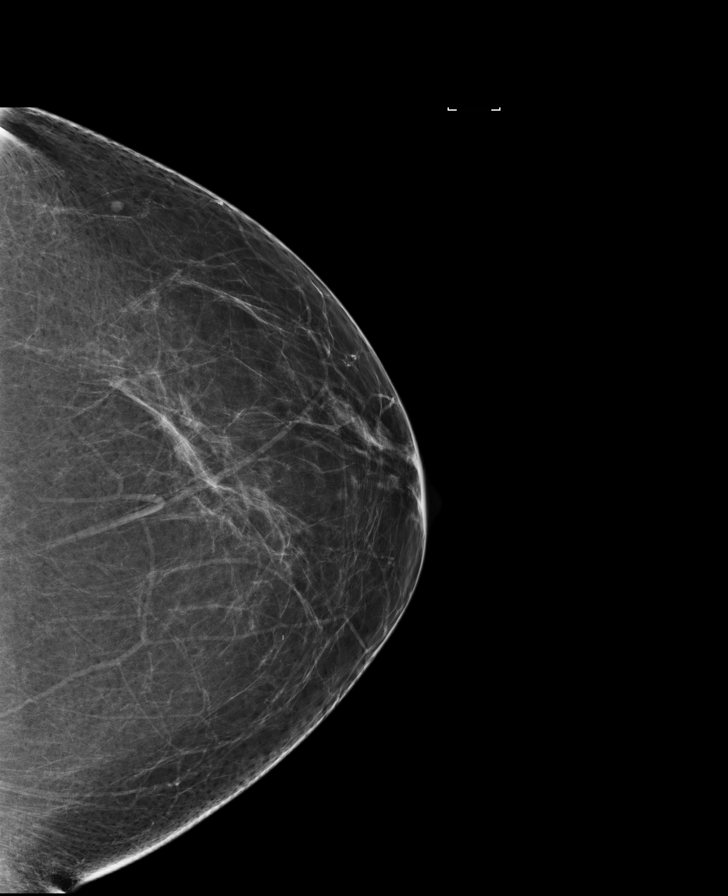

[R MLO synth-2D]
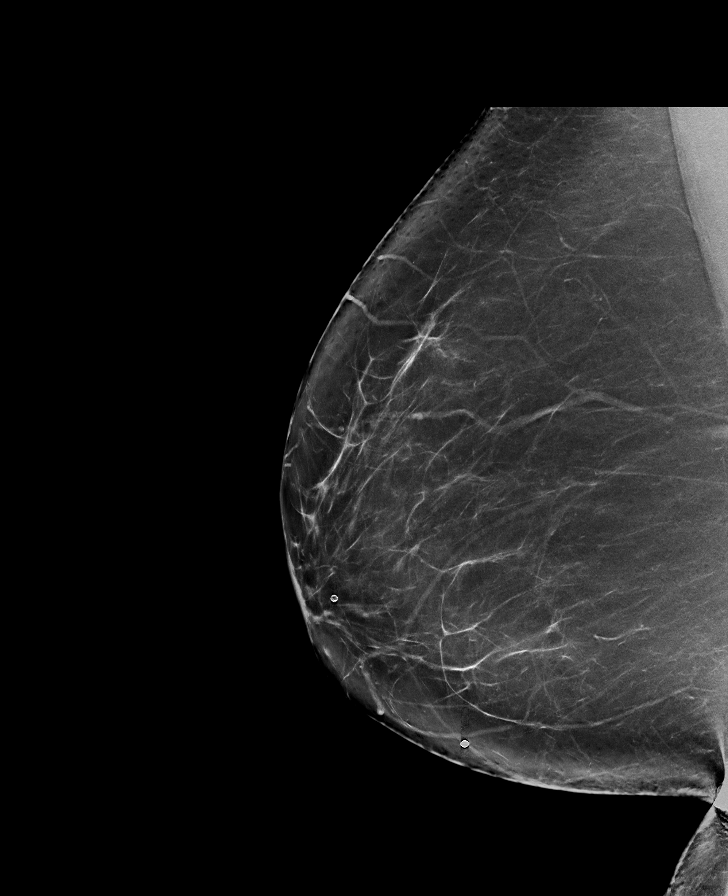

[R CC synth-2D]
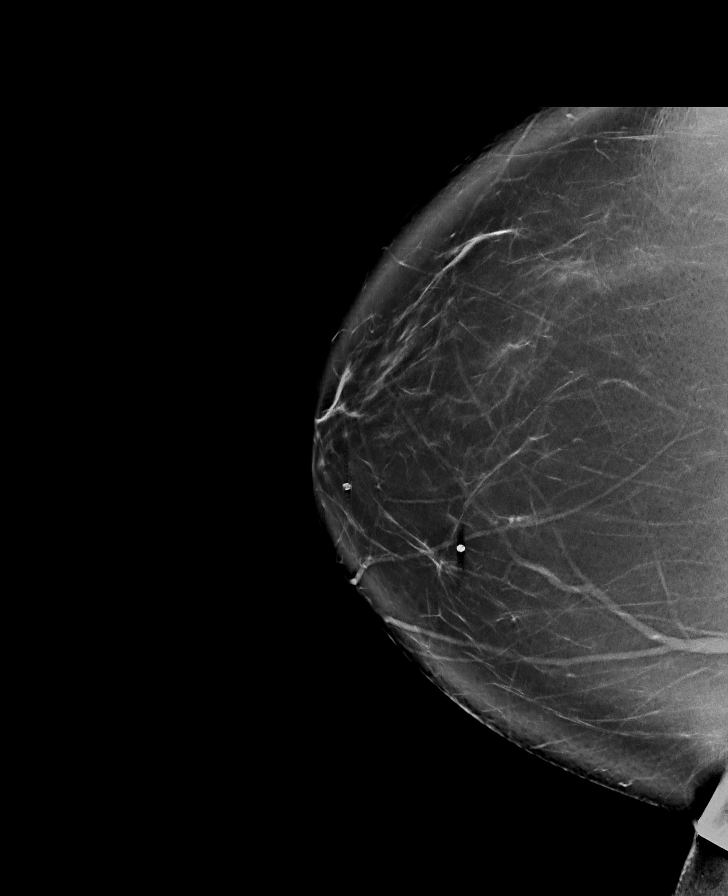

[8 of 35 positions shown; findings below may reference images not displayed]

FINDINGS: There is no mass, distortion, or worrisome calcification within
either breast. The parenchymal pattern is stable. In the area of
questioned nodularity with tenderness noted by the patient there is
predominately fatty tissue.

Mammographic images were processed with CAD.

On physical exam, there is no discrete palpable abnormality within
the inferior right breast.

Targeted ultrasound is performed, showing normal appearing fibro
fatty tissue in the area of concern.
IMPRESSION: No findings worrisome for malignancy.

RECOMMENDATION:
Bilateral screening mammography 1 year.

I have discussed the findings and recommendations with the patient.
Results were also provided in writing at the conclusion of the
visit. If applicable, a reminder letter will be sent to the patient
regarding the next appointment.

BI-RADS CATEGORY  1: Negative.

## 2018-11-03 ENCOUNTER — Other Ambulatory Visit: Payer: Self-pay

## 2018-11-03 ENCOUNTER — Encounter: Payer: Self-pay | Admitting: Emergency Medicine

## 2018-11-03 DIAGNOSIS — Z79899 Other long term (current) drug therapy: Secondary | ICD-10-CM | POA: Insufficient documentation

## 2018-11-03 DIAGNOSIS — M79604 Pain in right leg: Secondary | ICD-10-CM | POA: Insufficient documentation

## 2018-11-03 NOTE — ED Triage Notes (Signed)
Pt states that she was recently given Celebrex for inflammation in her shoulder. Pt states that "it's WC" due to an ongoing WC for her shoulder. Pt reports that she had right calf pain yesterday which is now also in the thigh. Pt reports that she is unable to stand for long on the right leg. Pt is in NAD.

## 2018-11-04 ENCOUNTER — Emergency Department
Admission: EM | Admit: 2018-11-04 | Discharge: 2018-11-04 | Disposition: A | Discharge status: Home / Self Care | Payer: BC Managed Care – PPO | Attending: Emergency Medicine | Admitting: Emergency Medicine

## 2018-11-04 ENCOUNTER — Emergency Department: Payer: BC Managed Care – PPO

## 2018-11-04 DIAGNOSIS — M79604 Pain in right leg: Secondary | ICD-10-CM

## 2018-11-04 MED ORDER — TRAMADOL HCL 50 MG PO TABS: 50.0000 mg | ORAL_TABLET | Freq: Four times a day (QID) | ORAL | 0 refills | Status: AC | PRN

## 2018-11-04 MED ORDER — LIDOCAINE 5 % EX PTCH
1.0000 | MEDICATED_PATCH | CUTANEOUS | Status: DC
  Administered 2018-11-04: 1 via TRANSDERMAL
  Filled 2018-11-04: qty 1

## 2018-11-04 MED ORDER — OXYCODONE-ACETAMINOPHEN 5-325 MG PO TABS
1.0000 | ORAL_TABLET | Freq: Once | ORAL | Status: AC
Start: 2018-11-04 — End: 2018-11-04
  Administered 2018-11-04: 1 via ORAL
  Filled 2018-11-04: qty 1

## 2018-11-04 NOTE — ED Provider Notes (Signed)
[facility] Emergency Department Provider Note   First MD Initiated Contact with Patient 11/04/18 0231     (approximate)  I have reviewed the triage vital signs and the nursing notes.   HISTORY  Chief Complaint Leg Pain    HPI Briana Burnett is a 52 y.o. female with below list of chronic medical conditions presents to the emergency department with nontraumatic right lower extremity anterior thigh and calf pain with onset yesterday.  Patient states that current pain score is 9 out of 10.  Patient states the pain is worse with any movement.  Patient denies any swelling.  Patient denies any chest pain or shortness of breath.  Patient denies any history of DVT or PE.  Patient does admit to "nerve damage to the lower extremities secondary to complication of rotator cuff surgery.  Patient referred to the ED to evaluate for possible DVT per primary care provider.  Patient also admits to low back discomfort.  Past Medical History:  Diagnosis Date  . Allergy   . Anemia    h/o  . Complication of anesthesia   . Endometriosis   . GERD (gastroesophageal reflux disease)   . Headache   . Heart palpitations    has seen dr Mariah Milling  . Low blood pressure, not hypotension   . PONV (postoperative nausea and vomiting)    nasuea  . Stress incontinence     Patient Active Problem List   Diagnosis Date Noted  . Preoperative examination 03/27/2018  . 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 Epigastric pain 08/22/2017  . Loose stools 08/22/2017  . SOB (shortness of breath) 01/16/2016  . Mood change 05/21/2015  . Obesity (BMI 30-39.9) 05/21/2015  . Nocturia 07/24/2013  . Allergic rhinitis 03/13/2012  . Hearing loss in right ear 03/13/2012  . Routine general medical examination at a health care facility 11/25/2011  . New daily persistent headache 12/18/2009  . Nausea 12/18/2009  . FATIGUE 02/04/2009  . PALPITATIONS 08/16/2007    Past Surgical History:  Procedure Laterality Date   . ABDOMINAL HYSTERECTOMY    . BREAST EXCISIONAL BIOPSY Right    neg   . MRI  05/12/2000   brain was neg  . PLANTAR FASCIA SURGERY Left   . SHOULDER ARTHROSCOPY WITH ROTATOR CUFF REPAIR Right 04/03/2018   Procedure: Right SHOULDER ARTHROSCOPY WITH ROTATOR CUFF REPAIR, distal clavicle excision, decompression and biceps tenotomy;  Surgeon: Lyndle Herrlich, MD;  Location: [facility] ORS;  Service: Orthopedics;  Laterality: Right;  . TONSILLECTOMY     age 83    Prior to Admission medications   Medication Sig Start Date End Date Taking? Authorizing Provider  aspirin-acetaminophen-caffeine (MIGRAINE RELIEF) 250-250-65 MG tablet Take 1 tablet by mouth every 6 (six) hours as needed for headache.    [provider]  cyclobenzaprine (FLEXERIL) 10 MG tablet Take 10 mg by mouth 3 (three) times daily as needed for muscle spasms.    [provider]  docusate sodium (COLACE) 100 MG capsule Take 1 capsule (100 mg total) by mouth daily as needed. 04/03/18 04/03/19  Lyndle Herrlich, MD  HYDROcodone-acetaminophen (NORCO/VICODIN) 5-325 MG tablet Take 1 tablet by mouth every 4 (four) hours as needed for moderate pain. 04/03/18 04/03/19  Lyndle Herrlich, MD  pantoprazole (PROTONIX) 40 MG tablet Take 1 tablet (40 mg total) by mouth daily. 05/09/18   Benancio Deeds, MD  pantoprazole (PROTONIX) 40 MG tablet TAKE 1 TABLET(40 MG) BY MOUTH DAILY 07/04/18   Armbruster, Willaim Rayas,  MD  traMADol (ULTRAM) 50 MG tablet Take 50 mg by mouth every 6 (six) hours as needed for moderate pain.    [provider]  traMADol (ULTRAM) 50 MG tablet Take 1 tablet (50 mg total) by mouth every 6 (six) hours as needed. 11/04/18 11/04/19  Darci Current, MD    Allergies Cortisone; Ibuprofen; and Meloxicam  Family History  Problem Relation Age of Onset  . COPD Mother   . Pulmonary embolism Mother   . Irritable bowel syndrome Mother   . Cancer Sister 32       breast ca  . Breast cancer Sister 28  . Heart disease  Father   . Mental illness Father 30       dementia  . COPD Father   . Alcohol abuse Father   . Cancer Maternal Aunt        breast ca  . Breast cancer Maternal Aunt   . Heart disease Maternal Uncle   . Crohn's disease Maternal Uncle   . Heart disease Maternal Grandmother   . Cancer Maternal Aunt        breast ca  . Breast cancer Maternal Aunt     Social History Social History   Tobacco Use  . Smoking status: Never Smoker  . Smokeless tobacco: Never Used  Substance Use Topics  . Alcohol use: No    Alcohol/week: 0.0 standard drinks  . Drug use: No    Review of Systems Constitutional: No fever/chills Eyes: No visual changes. ENT: No sore throat. Cardiovascular: Denies chest pain. Respiratory: Denies shortness of breath. Gastrointestinal: No abdominal pain.  No nausea, no vomiting.  No diarrhea.  No constipation. Genitourinary: Negative for dysuria. Musculoskeletal: Negative for neck pain.  Negative for back pain.  Positive for right leg pain Integumentary: Negative for rash. Neurological: Negative for headaches, focal weakness or numbness.   ____________________________________________   PHYSICAL EXAM:  VITAL SIGNS: ED Triage Vitals [11/03/18 2338]  Enc Vitals Group     BP (!) 108/56     Pulse Rate 75     Resp 20     Temp 98.2 F (36.8 C)     Temp Source Oral     SpO2 98 %     Weight 99.8 kg (220 lb)     Height 1.626 m (5\' 4" )     Head Circumference      Peak Flow      Pain Score 8     Pain Loc      Pain Edu?      Excl. in GC?     Constitutional: Alert and oriented. Well appearing and in no acute distress. Mouth/Throat: Mucous membranes are moist.  Oropharynx non-erythematous. Neck: No stridor.   Cardiovascular: Normal rate, regular rhythm. Good peripheral circulation. Grossly normal heart sounds. Respiratory: Normal respiratory effort.  No retractions. Lungs CTAB. Gastrointestinal: Soft and nontender. No distention.  Musculoskeletal: No lower  extremity tenderness nor edema. No gross deformities of extremities.  Equal palpable DP PT pulses bilaterally.  . Pain with right lumbar paraspinal muscle palpation Neurologic:  Normal speech and language. No gross focal neurologic deficits are appreciated.  Skin:  Skin is warm, dry and intact. No rash noted. Psychiatric: Mood and affect are normal. Speech and behavior are normal.  ____________________________________________   ________________________  RADIOLOGY I, Darci Current, personally viewed and evaluated these images (plain radiographs) as part of my medical decision making, as well as reviewing the written report by the radiologist.  ED  MD interpretation: No evidence of DVT right lower extremity.  Official radiology report(s): [place] Venous Img Lower Unilateral Right  Result Date: 11/04/2018 CLINICAL DATA:  Right calf pain. EXAM: RIGHT LOWER EXTREMITY VENOUS DOPPLER ULTRASOUND TECHNIQUE: Gray-scale sonography with graded compression, as well as color Doppler and duplex ultrasound were performed to evaluate the lower extremity deep venous systems from the level of the common femoral vein and including the common femoral, femoral, profunda femoral, popliteal and calf veins including the posterior tibial, peroneal and gastrocnemius veins when visible. The superficial great saphenous vein was also interrogated. Spectral Doppler was utilized to evaluate flow at rest and with distal augmentation maneuvers in the common femoral, femoral and popliteal veins. COMPARISON:  None. FINDINGS: Contralateral Common Femoral Vein: Respiratory phasicity is normal and symmetric with the symptomatic side. No evidence of thrombus. Normal compressibility. Common Femoral Vein: No evidence of thrombus. Normal compressibility, respiratory phasicity and response to augmentation. Saphenofemoral Junction: No evidence of thrombus. Normal compressibility and flow on color Doppler imaging. Profunda Femoral Vein: No  evidence of thrombus. Normal compressibility and flow on color Doppler imaging. Femoral Vein: No evidence of thrombus. Normal compressibility, respiratory phasicity and response to augmentation. Popliteal Vein: No evidence of thrombus. Normal compressibility, respiratory phasicity and response to augmentation. Calf Veins: No evidence of thrombus. Normal compressibility and flow on color Doppler imaging. Superficial Great Saphenous Vein: No evidence of thrombus. Normal compressibility. Venous Reflux:  None. Other Findings:  None. IMPRESSION: No evidence of right lower extremity deep venous thrombosis. Electronically Signed   By: Narda RutherfordMelanie  Sanford M.D.   On: 11/04/2018 01:27      Procedures   ____________________________________________   INITIAL IMPRESSION / ASSESSMENT AND PLAN / ED COURSE  As part of my medical decision making, I reviewed the following data within the electronic MEDICAL RECORD NUMBER  52 year old female presenting with above-stated history and physical exam secondary to right lower extremity /back pain.  Venous ultrasound of the right lower extremity performed revealed no evidence of DVT or PE.  Patient neurovascularly intact on the right lower extremity.  Consider possibility of lumbar radiculopathy as the etiology for the patient's pain.  Spoke with the patient at length regarding necessity of following up outpatient with orthopedic surgery for further evaluation. ____________________________________________  FINAL CLINICAL IMPRESSION(S) / ED DIAGNOSES  Final diagnoses:  Pain of right lower extremity     MEDICATIONS GIVEN DURING THIS VISIT:  Medications  lidocaine (LIDODERM) 5 % 1 patch (1 patch Transdermal Patch Applied 11/04/18 0237)  oxyCODONE-acetaminophen (PERCOCET/ROXICET) 5-325 MG per tablet 1 tablet (1 tablet Oral Given 11/04/18 0237)     ED Discharge Orders         Ordered    traMADol (ULTRAM) 50 MG tablet  Every 6 hours PRN     11/04/18 [id number]            Note:  This document was prepared using Dragon voice recognition software and may include unintentional dictation errors.    Darci CurrentBrown, [place] N, MD 11/04/18 [phone]

## 2019-01-14 ENCOUNTER — Telehealth: Payer: Self-pay | Admitting: Family Medicine

## 2019-01-14 DIAGNOSIS — Z0000 Encounter for general adult medical examination without abnormal findings: Secondary | ICD-10-CM

## 2019-01-14 NOTE — Telephone Encounter (Signed)
-----   Message from Alvina Chou sent at 01/08/2019  2:39 PM EST ----- Regarding: Lab orders for Tuesday, 2.11.20 Patient is scheduled for CPX labs, please order future labs, Thanks , Camelia Eng

## 2019-01-16 ENCOUNTER — Other Ambulatory Visit (INDEPENDENT_AMBULATORY_CARE_PROVIDER_SITE_OTHER): Payer: BC Managed Care – PPO

## 2019-01-16 DIAGNOSIS — Z0000 Encounter for general adult medical examination without abnormal findings: Secondary | ICD-10-CM

## 2019-01-16 LAB — CBC WITH DIFFERENTIAL/PLATELET
Basophils Absolute: 0 10*3/uL (ref 0.0–0.1)
Basophils Relative: 0.4 % (ref 0.0–3.0)
EOS PCT: 1.8 % (ref 0.0–5.0)
Eosinophils Absolute: 0.1 10*3/uL (ref 0.0–0.7)
HCT: 38.6 % (ref 36.0–46.0)
Hemoglobin: 13 g/dL (ref 12.0–15.0)
LYMPHS ABS: 1.3 10*3/uL (ref 0.7–4.0)
Lymphocytes Relative: 18 % (ref 12.0–46.0)
MCHC: 33.6 g/dL (ref 30.0–36.0)
MCV: 98.6 fl (ref 78.0–100.0)
MONO ABS: 0.7 10*3/uL (ref 0.1–1.0)
MONOS PCT: 10 % (ref 3.0–12.0)
NEUTROS ABS: 4.8 10*3/uL (ref 1.4–7.7)
NEUTROS PCT: 69.8 % (ref 43.0–77.0)
PLATELETS: 282 10*3/uL (ref 150.0–400.0)
RBC: 3.91 Mil/uL (ref 3.87–5.11)
RDW: 13.6 % (ref 11.5–15.5)
WBC: 6.9 10*3/uL (ref 4.0–10.5)

## 2019-01-16 LAB — LIPID PANEL
CHOL/HDL RATIO: 4
Cholesterol: 155 mg/dL (ref 0–200)
HDL: 38.1 mg/dL — ABNORMAL LOW (ref >39.00)
LDL Cholesterol: 99 mg/dL (ref 0–99)
NONHDL: 116.44
Triglycerides: 89 mg/dL (ref 0.0–149.0)
VLDL: 17.8 mg/dL (ref 0.0–40.0)

## 2019-01-16 LAB — COMPREHENSIVE METABOLIC PANEL
ALK PHOS: 92 U/L (ref 39–117)
ALT: 22 U/L (ref 0–35)
AST: 15 U/L (ref 0–37)
Albumin: 4.1 g/dL (ref 3.5–5.2)
BILIRUBIN TOTAL: 0.5 mg/dL (ref 0.2–1.2)
BUN: 14 mg/dL (ref 6–23)
CO2: 31 meq/L (ref 19–32)
Calcium: 9.1 mg/dL (ref 8.4–10.5)
Chloride: 103 mEq/L (ref 96–112)
Creatinine, Ser: 0.66 mg/dL (ref 0.40–1.20)
GFR: 93.87 mL/min (ref >60.00)
GLUCOSE: 102 mg/dL — AB (ref 70–99)
POTASSIUM: 4 meq/L (ref 3.5–5.1)
SODIUM: 140 meq/L (ref 135–145)
TOTAL PROTEIN: 7.2 g/dL (ref 6.0–8.3)

## 2019-01-16 LAB — TSH: TSH: 1.35 u[IU]/mL (ref 0.35–4.50)

## 2019-01-22 ENCOUNTER — Encounter: Payer: Self-pay | Admitting: Family Medicine

## 2019-01-22 ENCOUNTER — Ambulatory Visit (INDEPENDENT_AMBULATORY_CARE_PROVIDER_SITE_OTHER): Payer: BC Managed Care – PPO | Admitting: Family Medicine

## 2019-01-22 VITALS — BP 122/76 | HR 54 | Temp 98.5°F | Ht 63.5 in | Wt 234.1 lb

## 2019-01-22 DIAGNOSIS — Z23 Encounter for immunization: Secondary | ICD-10-CM | POA: Diagnosis not present

## 2019-01-22 DIAGNOSIS — Z1231 Encounter for screening mammogram for malignant neoplasm of breast: Secondary | ICD-10-CM | POA: Insufficient documentation

## 2019-01-22 DIAGNOSIS — Z0000 Encounter for general adult medical examination without abnormal findings: Secondary | ICD-10-CM | POA: Diagnosis not present

## 2019-01-22 DIAGNOSIS — G5712 Meralgia paresthetica, left lower limb: Secondary | ICD-10-CM | POA: Insufficient documentation

## 2019-01-22 DIAGNOSIS — M25511 Pain in right shoulder: Secondary | ICD-10-CM

## 2019-01-22 DIAGNOSIS — G8929 Other chronic pain: Secondary | ICD-10-CM | POA: Insufficient documentation

## 2019-01-22 NOTE — Patient Instructions (Addendum)
For weight loss and diabetes prevention  Try to get most of your carbohydrates from produce (with the exception of white potatoes)  Eat less bread/pasta/rice/snack foods/cereals/sweets and other items from the middle of the grocery store (processed carbs)   Think about getting a recumbent exercise bike  This would be your best bet for exercise   Flu shot today   If you are interested in a shingles vaccine (shingrix)  If covered (check with your insurance)- call us to get on our wait list  You can also get on a list at your pharmacy   We should try for a mammogram - best you can  Check with your insurance to see if MRI would be covered due to your strong family hx and limited arm range of motion   Let us know if you want a referral to the sleep clinic to rule out sleep apnea

## 2019-01-22 NOTE — Assessment & Plan Note (Signed)
Reviewed health habits including diet and exercise and skin cancer prevention Reviewed appropriate screening tests for age  Also reviewed health mt list, fam hx and immunization status , as well as social and family history   See HPI Labs reviewed  Challenging situation for self care with inability to move R shoulder and also L leg neurologic damage from surgery  Ref for mammogram Declined breast exam  Flu shot today  Disc shingrix vaccine  Enc wt loss  Enc pt to consider sleep eval - suspect sleep apnea

## 2019-01-22 NOTE — Assessment & Plan Note (Signed)
S/p 2 surgeries  For MRI soon  This has made it difficult to exercise or move at all

## 2019-01-22 NOTE — Assessment & Plan Note (Signed)
Discussed how this problem influences overall health and the risks it imposes  Reviewed plan for weight loss with lower calorie diet (via better food choices and also portion control or program like weight watchers) and exercise building up to or more than 30 minutes 5 days per week including some aerobic activity   Lost wt and then gained it back after multiple shoulder surgeries and inability to move  Will look into getting new recumbent bike Also get back to better diet

## 2019-01-22 NOTE — Assessment & Plan Note (Signed)
Ref for mammogram done Declined breast exam today (her R shoulder is too painful and she does not want to remove shirt or bra) Enc self exams  May not be able to lift arm for R mammogram - aware/will do the best they can Strong fam hx of breast cancer (sister in 30s) Will check with her ins to see if they would cover MRI and let us know

## 2019-01-22 NOTE — Assessment & Plan Note (Signed)
Pt takes gabapentin  Caused by strap used to keep her on table during shoulder surgery  Hoping for improvement with time

## 2019-01-22 NOTE — Progress Notes (Signed)
Subjective:    Patient ID: Briana Burnett, female    DOB: August 15, 1966, 53 y.o.   MRN: [id number]  HPI Here for health maintenance exam and to review chronic medical problems    Had shoulder surgery in April  Again in October (it re tore during tx- rotator cuff) -did not improve much after that  Has another MRI planned next week  Wants to avoid another surgery  Not sleeping well many nights  Cannot abduct her arm past about 30 deg Husband has to help her dressed   Also injured her lateral femoral cutaneous nerve in L leg as a result of her surgery  (strapped on to the table)  Wt Readings from Last 3 Encounters:  01/22/19 234 lb 1 oz (106.2 kg)  11/03/18 220 lb (99.8 kg)  04/03/18 225 lb (102.1 kg)  had lost 13 lb before her 2nd shoulder surgery  Now she is limited with exercise (and her pain med make her light headed) - gabapentin and tramadol  40.81 kg/m   She has always woken up with nausea so does not eat in the am  Needs to drink more water  When she did loose wt she cut portions and limited carbs   Would consider using a recumbent bike (hers is broken)  BP Readings from Last 3 Encounters:  01/22/19 122/76  11/03/18 (!) 108/56  04/03/18 (!) 114/96   Pulse Readings from Last 3 Encounters:  01/22/19 (!) 54  11/03/18 75  04/03/18 67   Pap/gyn care Total hysterectomy in the past   Mammogram 11/17  Sister had breast cancer in her 30s  Breast cancer in aunts as well  Self breast exam -no lumps   Has not had a flu shot this season -will get it today   Tetanus shot 4/13  Colonoscopy 11/18 nl with 10  y recall   HIV screen- declined due to low risk status   Zoster status -interested in shingles vaccine   Thinks her husband witnessed some apnea in the past Wakes herself up snoring  Wakes up with a headache  ? If sleep apnea   Cholesterol  Lab Results  Component Value Date   CHOL 155 01/16/2019   CHOL 164 05/21/2015   CHOL 173 03/06/2012   Lab  Results  Component Value Date   HDL 38.10 (L) 01/16/2019   HDL 39.30 05/21/2015   HDL 48.30 03/06/2012   Lab Results  Component Value Date   LDLCALC 99 01/16/2019   LDLCALC 109 (H) 05/21/2015   LDLCALC 113 (H) 03/06/2012   Lab Results  Component Value Date   TRIG 89.0 01/16/2019   TRIG 81.0 05/21/2015   TRIG 60.0 03/06/2012   Lab Results  Component Value Date   CHOLHDL 4 01/16/2019   CHOLHDL 4 05/21/2015   CHOLHDL 4 03/06/2012   No results found for: LDLDIRECT Without exercise HDL went down LDL went down also  Other labs Lab Results  Component Value Date   WBC 6.9 01/16/2019   HGB 13.0 01/16/2019   HCT 38.6 01/16/2019   MCV 98.6 01/16/2019   PLT 282.0 01/16/2019   Glucose 102  Lab Results  Component Value Date   TSH 1.35 01/16/2019    Lab Results  Component Value Date   ALT 22 01/16/2019   AST 15 01/16/2019   ALKPHOS 92 01/16/2019   BILITOT 0.5 01/16/2019    Lab Results  Component Value Date   CREATININE 0.66 01/16/2019   BUN 14 01/16/2019  NA 140 01/16/2019   K 4.0 01/16/2019   CL 103 01/16/2019   CO2 31 01/16/2019       Patient Active Problem List   Diagnosis Date Noted  . Breast cancer screening by mammogram 01/22/2019  . Preoperative examination 03/27/2018  . 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 Epigastric pain 08/22/2017  . Loose stools 08/22/2017  . SOB (shortness of breath) 01/16/2016  . Mood change 05/21/2015  . Morbid obesity (HCC) 05/21/2015  . Nocturia 07/24/2013  . Allergic rhinitis 03/13/2012  . Hearing loss in right ear 03/13/2012  . Routine general medical examination at a health care facility 11/25/2011  . New daily persistent headache 12/18/2009  . Nausea 12/18/2009  . FATIGUE 02/04/2009  . PALPITATIONS 08/16/2007   Past Medical History:  Diagnosis Date  . Allergy   . Anemia    h/o  . Complication of anesthesia   . Endometriosis   . GERD (gastroesophageal reflux disease)   . Headache   . Heart palpitations    has seen  dr Mariah Milling  . Low blood pressure, not hypotension   . PONV (postoperative nausea and vomiting)    nasuea  . Stress incontinence    Past Surgical History:  Procedure Laterality Date  . ABDOMINAL HYSTERECTOMY    . BREAST EXCISIONAL BIOPSY Right    neg   . MRI  05/12/2000   brain was neg  . PLANTAR FASCIA SURGERY Left   . SHOULDER ARTHROSCOPY WITH ROTATOR CUFF REPAIR Right 04/03/2018   Procedure: Right SHOULDER ARTHROSCOPY WITH ROTATOR CUFF REPAIR, distal clavicle excision, decompression and biceps tenotomy;  Surgeon: Lyndle Herrlich, MD;  Location: [facility] OR[facility];  Service: Orthopedics;  Laterality: Right;  . TONSILLECTOMY     age 15   Social History   Tobacco Use  . Smoking status: Never Smoker  . Smokeless tobacco: Never Used  Substance Use Topics  . Alcohol use: No    Alcohol/week: 0.0 standard drinks  . Drug use: No   Family History  Problem Relation Age of Onset  . COPD Mother   . Pulmonary embolism Mother   . Irritable bowel syndrome Mother   . Cancer Sister 78       breast ca  . Breast cancer Sister 53  . Heart disease Father   . Mental illness Father 38       dementia  . COPD Father   . Alcohol abuse Father   . Cancer Maternal Aunt        breast ca  . Breast cancer Maternal Aunt   . Heart disease Maternal Uncle   . Crohn's disease Maternal Uncle   . Heart disease Maternal Grandmother   . Cancer Maternal Aunt        breast ca  . Breast cancer Maternal Aunt    Allergies  Allergen Reactions  . Celebrex [Celecoxib] Other (See Comments)    Leg pain and oral blisters   . Cortisone Other (See Comments)    Cortisone shot caused uvula swelling, blisters in throat  . Ibuprofen Other (See Comments)    Several doses causes blurred vision and light headed. PT states it only affects here if she takes several in a short amount of time.   . Meloxicam    Current Outpatient Medications on File Prior to Visit  Medication Sig Dispense Refill  . cyclobenzaprine (FLEXERIL) 10  MG tablet Take 10 mg by mouth 3 (three) times daily as needed for muscle spasms.    . diclofenac sodium (VOLTAREN)  1 % GEL Apply 1 application topically 4 (four) times daily as needed.    . gabapentin (NEURONTIN) 100 MG capsule Take 2 capsules by mouth 2 (two) times daily.    Marland Kitchen HYDROcodone-acetaminophen (NORCO/VICODIN) 5-325 MG tablet Take 1 tablet by mouth every 4 (four) hours as needed for moderate pain. 20 tablet 0  . pantoprazole (PROTONIX) 40 MG tablet Take 1 tablet (40 mg total) by mouth daily. 90 tablet 1  . traMADol (ULTRAM) 50 MG tablet Take 1 tablet (50 mg total) by mouth every 6 (six) hours as needed. 20 tablet 0   No current facility-administered medications on file prior to visit.     Review of Systems  Constitutional: Positive for fatigue. Negative for activity change, appetite change, fever and unexpected weight change.  HENT: Negative for congestion, ear pain, rhinorrhea, sinus pressure and sore throat.        Loud snoring and witnessed apnea by her husband  Eyes: Negative for pain, redness and visual disturbance.  Respiratory: Negative for cough, shortness of breath and wheezing.   Cardiovascular: Negative for chest pain and palpitations.       Heart palpitations -worse with walking Plans to f/u with her cardiologist   Gastrointestinal: Negative for abdominal pain, blood in stool, constipation and diarrhea.  Endocrine: Negative for polydipsia and polyuria.  Genitourinary: Negative for dysuria, frequency and urgency.  Musculoskeletal: Positive for arthralgias and back pain. Negative for joint swelling and myalgias.  Skin: Negative for pallor and rash.  Allergic/Immunologic: Negative for environmental allergies.  Neurological: Positive for weakness and numbness. Negative for dizziness, tremors, syncope and headaches.       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  Does not bruise/bleed easily.  Psychiatric/Behavioral: Negative for decreased concentration and dysphoric mood. The patient is not nervous/anxious.        Objective:   Physical Exam Constitutional:      General: She is not in acute distress.    Appearance: Normal appearance. She is well-developed. She is obese. She is not ill-appearing.  HENT:     Head: Normocephalic and atraumatic.     Right Ear: Tympanic membrane, ear canal and external ear normal.     Left Ear: Tympanic membrane, ear canal and external ear normal.     Nose: Nose normal.     Mouth/Throat:     Mouth: Mucous membranes are moist.     Pharynx: Oropharynx is clear.  Eyes:     General: No scleral icterus.       Right eye: No discharge.        Left eye: No discharge.     Conjunctiva/sclera: Conjunctivae normal.     Pupils: Pupils are equal, round, and reactive to light.  Neck:     Musculoskeletal: Normal range of motion and neck supple. No neck rigidity.     Thyroid: No thyromegaly.     Vascular: No carotid bruit or JVD.  Cardiovascular:     Rate and Rhythm: Regular rhythm. Bradycardia present.     Pulses: Normal pulses.     Heart sounds: Normal heart sounds. No gallop.   Pulmonary:     Effort: Pulmonary effort is normal. No respiratory distress.     Breath sounds: Normal breath sounds. No wheezing or rales.  Abdominal:     General: Bowel sounds are normal. There is no distension.     Palpations: Abdomen is  soft. There is no mass.     Tenderness: There is no abdominal tenderness.  Genitourinary:    Comments: Declined breast exam -did not want to remove bra due to severe shoulder pain  Musculoskeletal:        General: No tenderness or deformity.     Right lower leg: No edema.     Left lower leg: No edema.     Comments: Very limited rom of R shoulder  Hyper sensitive lateral L leg   Lymphadenopathy:     Cervical: No cervical adenopathy.  Skin:    General: Skin is warm and dry.     Capillary Refill:  Capillary refill takes less than 2 seconds.     Coloration: Skin is not pale.     Findings: No erythema or rash.  Neurological:     Mental Status: She is alert. Mental status is at baseline.     Cranial Nerves: No cranial nerve deficit.     Motor: No abnormal muscle tone.     Coordination: Coordination normal.     Deep Tendon Reflexes: Reflexes are normal and symmetric. Reflexes normal.  Psychiatric:        Mood and Affect: Mood normal.           Assessment & Plan:   Problem List Items Addressed This Visit      Other   Routine general medical examination at a health care facility - Primary    Reviewed health habits including diet and exercise and skin cancer prevention Reviewed appropriate screening tests for age  Also reviewed health mt list, fam hx and immunization status , as well as social and family history   See HPI Labs reviewed  Challenging situation for self care with inability to move R shoulder and also L leg neurologic damage from surgery  Ref for mammogram Declined breast exam  Flu shot today  Disc shingrix vaccine  Enc wt loss  Enc pt to consider sleep eval - suspect sleep apnea       Morbid obesity (HCC)    Discussed how this problem influences overall health and the risks it imposes  Reviewed plan for weight loss with lower calorie diet (via better food choices and also portion control or program like weight watchers) and exercise building up to or more than 30 minutes 5 days per week including some aerobic activity   Lost wt and then gained it back after multiple shoulder surgeries and inability to move  Will look into getting new recumbent bike Also get back to better diet       Breast cancer screening by mammogram    Ref for mammogram done Declined breast exam today (her R shoulder is too painful and she does not want to remove shirt or bra) Enc self exams  May not be able to lift arm for R mammogram - aware/will do the best they can Strong fam  hx of breast cancer (sister in 30s) Will check with her ins to see if they would cover MRI and let us know       Relevant Orders   MM 3D SCREEN BREAST BILATERAL

## 2019-02-08 ENCOUNTER — Ambulatory Visit
Admission: RE | Admit: 2019-02-08 | Discharge: 2019-02-08 | Disposition: A | Discharge status: Home / Self Care | Payer: BC Managed Care – PPO | Source: Ambulatory Visit | Attending: Family Medicine | Admitting: Family Medicine

## 2019-02-08 DIAGNOSIS — Z1231 Encounter for screening mammogram for malignant neoplasm of breast: Secondary | ICD-10-CM | POA: Diagnosis present

## 2019-05-07 ENCOUNTER — Telehealth: Payer: Self-pay

## 2019-05-07 NOTE — Telephone Encounter (Signed)
Pt called back and scheduled an appt for tomorrow.

## 2019-05-07 NOTE — Telephone Encounter (Signed)
Patient states that she has been noticing moderate amount of bright red blood in commode with BM for the last 2 days.  Only occurs with BM and she does not require a pad for any ongoing drainage or spotting.   She has history of hemorrhoids but does not feel that is the case now.   BM is light brown with small round to flat shapes, it is soft and she does not strain or feel constipated.    Patient has had a lower back ache X 2 days during symptoms.  She does not feel that she is constipated.   Her diet has been normal and the only change to her medication is that she has had a recent increase in her gabapentin 2 weeks ago.   She denies any nausea/vomitting or diarrhea and no abdominal or pelvic pain. Only has a decreased appetite.   Her COVID telephone screen was negative.    States she doesn't want to overreact but this is more than her usual occasional bloody smear on the toilet paper and has occurred 3 x over the past 48 hours.    Please advise if you would like for [place] to make her an appointment to come in to the office.

## 2019-05-07 NOTE — Telephone Encounter (Signed)
If it continues to happen - I need to see her in the office (or ref to GI if she prefers that)  thanks

## 2019-05-08 ENCOUNTER — Other Ambulatory Visit: Payer: Self-pay

## 2019-05-08 ENCOUNTER — Ambulatory Visit: Payer: BC Managed Care – PPO | Admitting: Family Medicine

## 2019-05-08 ENCOUNTER — Encounter: Payer: Self-pay | Admitting: Family Medicine

## 2019-05-08 VITALS — BP 122/68 | HR 66 | Temp 98.5°F | Ht 63.5 in | Wt 230.6 lb

## 2019-05-08 DIAGNOSIS — K625 Hemorrhage of anus and rectum: Secondary | ICD-10-CM | POA: Diagnosis not present

## 2019-05-08 MED ORDER — HYDROCORTISONE ACETATE 25 MG RE SUPP: 25.0000 mg | Freq: Two times a day (BID) | RECTAL | 0 refills | Status: DC

## 2019-05-08 NOTE — Patient Instructions (Signed)
Try the anusol hc suppository twice daily for 10 days to calm down your hemorrhoid  This should help stop the bleeding  If bleeding does not stop or if it worsens please let me know  If any abdominal or rectal pain also let us know   Avoid straining as much as you can and keep stools soft

## 2019-05-08 NOTE — Assessment & Plan Note (Signed)
With blood in stool (BRB) No other symptoms  Internal hemorrhoid found on exam  Px anusol hc suppositories to use bid for 10 d  If bleeding does not stop -inst to alert [place] and we will ref to GI to investigate other sources (reviewed reassuring colonoscopy from 2018)  Will watch for abd pain or rectal pain or other new symptoms

## 2019-05-08 NOTE — Progress Notes (Signed)
Subjective:    Patient ID: Briana Burnett, female    DOB: 1966/11/26, 53 y.o.   MRN: [id number]  HPI Here for concern re: rectal bleeding   Started Saturday night  Blood with BM  Not constipated at all  Even happened without bm once  Some pressure in low back and some in low abdomen  Cannot feel anything sticking out  No rectal pain at all  No pain to have a bm   No diarrhea  No nausea  No blood in urine   Feeling good in general -baseline   Has had hemorrhoid issues in the past   She has had a hysterectomy  Not particularly an easy bleeder  Usually 2 BM per day  Normal /eats fruit/veg   Wt Readings from Last 3 Encounters:  05/08/19 230 lb 9 oz (104.6 kg)  01/22/19 234 lb 1 oz (106.2 kg)  11/03/18 220 lb (99.8 kg)   40.20 kg/m   Colonoscopy normal 11/18 with 10 y recall  (reviewed with pt) Internal hemorrhoids were noted   Patient Active Problem List   Diagnosis Date Noted  . Rectal bleeding 05/08/2019  . Breast cancer screening by mammogram 01/22/2019  . Chronic right shoulder pain 01/22/2019  . Lateral femoral cutaneous neuropathy, 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  . Preoperative examination 03/27/2018  . 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 SOB (shortness of breath) 01/16/2016  . Mood change 05/21/2015  . Morbid obesity (HCC) 05/21/2015  . Nocturia 07/24/2013  . Allergic rhinitis 03/13/2012  . Hearing loss in right ear 03/13/2012  . Routine general medical examination at a health care facility 11/25/2011  . New daily persistent headache 12/18/2009  . FATIGUE 02/04/2009  . PALPITATIONS 08/16/2007   Past Medical History:  Diagnosis Date  . Allergy   . Anemia    h/o  . Complication of anesthesia   . Endometriosis   . GERD (gastroesophageal reflux disease)   . Headache   . Heart palpitations    has seen dr Mariah Millinggollan  . Low blood pressure, not hypotension   . PONV (postoperative nausea and vomiting)    nasuea  . Stress incontinence    Past Surgical History:   Procedure Laterality Date  . ABDOMINAL HYSTERECTOMY    . BREAST EXCISIONAL BIOPSY Right    neg   . MRI  05/12/2000   brain was neg  . PLANTAR FASCIA SURGERY Left   . SHOULDER ARTHROSCOPY WITH ROTATOR CUFF REPAIR Right 04/03/2018   Procedure: Right SHOULDER ARTHROSCOPY WITH ROTATOR CUFF REPAIR, distal clavicle excision, decompression and biceps tenotomy;  Surgeon: Lyndle HerrlichBowers, James R, MD;  Location: [facility];  Service: Orthopedics;  Laterality: Right;  . TONSILLECTOMY     age 53   Social History   Tobacco Use  . Smoking status: Never Smoker  . Smokeless tobacco: Never Used  Substance Use Topics  . Alcohol use: No    Alcohol/week: 0.0 standard drinks  . Drug use: No   Family History  Problem Relation Age of Onset  . COPD Mother   . Pulmonary embolism Mother   . Irritable bowel syndrome Mother   . Cancer Sister [id number]       breast ca  . Breast cancer Sister [id number]  . Heart disease Father   . Mental illness Father [id number]       dementia  . COPD Father   . Alcohol abuse Father   . Cancer Maternal Aunt        breast ca  . Breast cancer  Maternal Aunt 45  . Heart disease Maternal Uncle   . Crohn's disease Maternal Uncle   . Heart disease Maternal Grandmother   . Cancer Maternal Aunt        breast ca  . Breast cancer Maternal Aunt 60   Allergies  Allergen Reactions  . Celebrex [Celecoxib] Other (See Comments)    Leg pain and oral blisters   . Cortisone Other (See Comments)    Cortisone shot caused uvula swelling, blisters in throat  . Ibuprofen Other (See Comments)    Several doses causes blurred vision and light headed. PT states it only affects here if she takes several in a short amount of time.   . Meloxicam    Current Outpatient Medications on File Prior to Visit  Medication Sig Dispense Refill  . gabapentin (NEURONTIN) 100 MG capsule Take 2 capsules by mouth. 2 tablets in the morning and 3 tablets in the afternoon    . traMADol (ULTRAM) 50 MG tablet Take 1 tablet (50 mg total)  by mouth every 6 (six) hours as needed. 20 tablet 0   No current facility-administered medications on file prior to visit.     Review of Systems  Constitutional: Negative for activity change, appetite change, fatigue, fever and unexpected weight change.  HENT: Negative for congestion, ear pain, rhinorrhea, sinus pressure and sore throat.   Eyes: Negative for pain, redness and visual disturbance.  Respiratory: Negative for cough, shortness of breath and wheezing.   Cardiovascular: Negative for chest pain and palpitations.  Gastrointestinal: Positive for anal bleeding and blood in stool. Negative for abdominal distention, abdominal pain, constipation, diarrhea, nausea, rectal pain and vomiting.  Endocrine: Negative for polydipsia and polyuria.  Genitourinary: Negative for dysuria, frequency and urgency.  Musculoskeletal: Negative for arthralgias, back pain and myalgias.  Skin: Negative for pallor and rash.  Allergic/Immunologic: Negative for environmental allergies.  Neurological: Negative for dizziness, syncope, light-headedness and headaches.  Hematological: Negative for adenopathy. Does not bruise/bleed easily.  Psychiatric/Behavioral: Negative for decreased concentration and dysphoric mood. The patient is not nervous/anxious.        Objective:   Physical Exam Constitutional:      General: She is not in acute distress.    Appearance: Normal appearance. She is well-developed. She is obese. She is not ill-appearing or diaphoretic.  HENT:     Head: Normocephalic and atraumatic.     Mouth/Throat:     Mouth: Mucous membranes are moist.     Pharynx: Oropharynx is clear.  Eyes:     General: No scleral icterus.    Conjunctiva/sclera: Conjunctivae normal.     Pupils: Pupils are equal, round, and reactive to light.  Neck:     Musculoskeletal: Normal range of motion and neck supple.  Cardiovascular:     Rate and Rhythm: Normal rate and regular rhythm.     Heart sounds: Normal heart  sounds.  Pulmonary:     Effort: Pulmonary effort is normal. No respiratory distress.     Breath sounds: Normal breath sounds. No wheezing or rales.  Abdominal:     General: Bowel sounds are normal. There is no distension.     Palpations: Abdomen is soft. There is no mass.     Tenderness: There is no abdominal tenderness. There is no guarding or rebound.     Hernia: No hernia is present.  Genitourinary:    Comments: Internal hemorrhoid seen at 7:00- inflamed /not thrombosed or actively bleeding No pain on rectal exam Stool is heme positive  No fissures seen No external hemorrhoids Normal tone Lymphadenopathy:     Cervical: No cervical adenopathy.  Skin:    General: Skin is warm and dry.     Coloration: Skin is not pale.     Findings: No erythema.  Neurological:     Mental Status: She is alert.  Psychiatric:        Mood and Affect: Mood normal.           Assessment & Plan:   Problem List Items Addressed This Visit      Digestive   Rectal bleeding - Primary    With blood in stool (BRB) No other symptoms  Internal hemorrhoid found on exam  Px anusol hc suppositories to use bid for 10 d  If bleeding does not stop -inst to alert [place] and we will ref to GI to investigate other sources (reviewed reassuring colonoscopy from 2018)  Will watch for abd pain or rectal pain or other new symptoms

## 2019-05-25 ENCOUNTER — Telehealth: Payer: Self-pay | Admitting: Cardiovascular Disease

## 2019-05-25 NOTE — Telephone Encounter (Signed)
I spoke with the patient and advised her of Thurmond Butts Dunn's interpretation of her results and recommendations.  She is agreeable with coming in next week for further evaluation in Dr. Donivan Scull absence.  She is scheduled for Thursday 05/31/19 at 10:00 am.   I have also advised her to follow up with neurology regarding her gabapentin as well.   She voices understanding of the above and is agreeable.

## 2019-05-25 NOTE — Telephone Encounter (Signed)
I spoke with the patient. She states her neurologist increased her gabapentin about a week ago to 3 tablets BID. Since that time, she has had an increase in her palpitations. She has a history of PVC's.   She has a watch that will monitor her EKG. She would like to email these tracings to [place]. I have inquired if she is signed up for MyChart. She is having trouble with her MyChart access.  I have advised her she can email these tracings to me and I will get them pulled off and scanned in her chart.   I have also asked her if she has spoken with her neurologist about her symptoms and she states she has not. Per the patient "I don't have much faith in the neurologist like I do my cardiologist."  She is not taking any cardiac medications at this time.  She was last seen by Dr. Rockey Situ in 12/2017 and did not want to try propranolol/ bystolic at that time. She was to be a PRN follow up.  Will await tracings via email.

## 2019-05-25 NOTE — Telephone Encounter (Signed)
Review of strips shows sinus rhythm with PVCs and underlying artifact. I recommend the patient schedule a visit with her primary cardiologist to review the scanned in 7 pages of patient supplied rhythm strips in detail and discuss possible further needed testing including possible echo and stress testing along with medication adjustment. If she has concerns her gabapentin has played a role in palpitations she will need to discuss this medication with her neurologist. .

## 2019-05-25 NOTE — Telephone Encounter (Signed)
EKG tracings received and scanned in the patient's chart. Sent to Dr. Rockey Situ to review- under "result notes" tab in your basket.  Please review and advise.

## 2019-05-25 NOTE — Telephone Encounter (Signed)
Patient c/o Palpitations:  High priority if patient c/o lightheadedness, shortness of breath, or chest pain  How long have you had palpitations/irregular HR/ Afib? Are you having the symptoms now?  Worse since Gabapentin was increased. No symptoms now 1) Are you currently experiencing lightheadedness, SOB or CP?  Some SOB  2) Do you have a history of afib (atrial fibrillation) or irregular heart rhythm? yes  3) Have you checked your BP or HR? (document readings if available): last week it was 113/57  4) Are you experiencing any other symptoms? Tired and some SOB. SOB when palpitations increase.

## 2019-05-28 NOTE — Telephone Encounter (Signed)
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

## 2019-05-29 ENCOUNTER — Telehealth: Payer: Self-pay

## 2019-05-29 NOTE — Telephone Encounter (Signed)

## 2019-05-31 ENCOUNTER — Ambulatory Visit: Payer: BC Managed Care – PPO | Admitting: Nurse Practitioner

## 2019-05-31 ENCOUNTER — Ambulatory Visit: Payer: BC Managed Care – PPO | Admitting: Physician Assistant

## 2020-02-11 ENCOUNTER — Telehealth: Payer: Self-pay | Admitting: Family Medicine

## 2020-02-11 DIAGNOSIS — Z0000 Encounter for general adult medical examination without abnormal findings: Secondary | ICD-10-CM

## 2020-02-11 DIAGNOSIS — R7309 Other abnormal glucose: Secondary | ICD-10-CM | POA: Insufficient documentation

## 2020-02-11 NOTE — Telephone Encounter (Signed)
-----   Message from Aquilla Solian, RT sent at 01/31/2020  2:34 PM EST ----- Regarding: Lab Orders for Tuesday 3.9.2021 Please place lab orders for Tuesday 3.9.2021, office visit for physical on Tuesday 3.16.2021 Thank you, Jones Bales RT(R)

## 2020-02-12 ENCOUNTER — Other Ambulatory Visit (INDEPENDENT_AMBULATORY_CARE_PROVIDER_SITE_OTHER): Payer: BC Managed Care – PPO

## 2020-02-12 ENCOUNTER — Other Ambulatory Visit: Payer: Self-pay

## 2020-02-12 DIAGNOSIS — Z0000 Encounter for general adult medical examination without abnormal findings: Secondary | ICD-10-CM | POA: Diagnosis not present

## 2020-02-12 DIAGNOSIS — R7309 Other abnormal glucose: Secondary | ICD-10-CM

## 2020-02-12 LAB — CBC WITH DIFFERENTIAL/PLATELET
Basophils Absolute: 0 10*3/uL (ref 0.0–0.1)
Basophils Relative: 0.7 % (ref 0.0–3.0)
Eosinophils Absolute: 0.1 10*3/uL (ref 0.0–0.7)
Eosinophils Relative: 1.9 % (ref 0.0–5.0)
HCT: 38.8 % (ref 36.0–46.0)
Hemoglobin: 12.9 g/dL (ref 12.0–15.0)
Lymphocytes Relative: 18 % (ref 12.0–46.0)
Lymphs Abs: 1.1 10*3/uL (ref 0.7–4.0)
MCHC: 33.2 g/dL (ref 30.0–36.0)
MCV: 101.6 fl — ABNORMAL HIGH (ref 78.0–100.0)
Monocytes Absolute: 0.6 10*3/uL (ref 0.1–1.0)
Monocytes Relative: 9.7 % (ref 3.0–12.0)
Neutro Abs: 4.4 10*3/uL (ref 1.4–7.7)
Neutrophils Relative %: 69.7 % (ref 43.0–77.0)
Platelets: 273 10*3/uL (ref 150.0–400.0)
RBC: 3.82 Mil/uL — ABNORMAL LOW (ref 3.87–5.11)
RDW: 13.2 % (ref 11.5–15.5)
WBC: 6.4 10*3/uL (ref 4.0–10.5)

## 2020-02-12 LAB — LIPID PANEL
Cholesterol: 171 mg/dL (ref 0–200)
HDL: 40.3 mg/dL (ref >39.00)
LDL Cholesterol: 117 mg/dL — ABNORMAL HIGH (ref 0–99)
NonHDL: 130.24
Total CHOL/HDL Ratio: 4
Triglycerides: 65 mg/dL (ref 0.0–149.0)
VLDL: 13 mg/dL (ref 0.0–40.0)

## 2020-02-12 LAB — COMPREHENSIVE METABOLIC PANEL
ALT: 20 U/L (ref 0–35)
AST: 15 U/L (ref 0–37)
Albumin: 4 g/dL (ref 3.5–5.2)
Alkaline Phosphatase: 95 U/L (ref 39–117)
BUN: 12 mg/dL (ref 6–23)
CO2: 31 mEq/L (ref 19–32)
Calcium: 9.1 mg/dL (ref 8.4–10.5)
Chloride: 105 mEq/L (ref 96–112)
Creatinine, Ser: 0.73 mg/dL (ref 0.40–1.20)
GFR: 83.22 mL/min (ref >60.00)
Glucose, Bld: 89 mg/dL (ref 70–99)
Potassium: 4.2 mEq/L (ref 3.5–5.1)
Sodium: 139 mEq/L (ref 135–145)
Total Bilirubin: 0.4 mg/dL (ref 0.2–1.2)
Total Protein: 7.2 g/dL (ref 6.0–8.3)

## 2020-02-12 LAB — TSH: TSH: 1.5 u[IU]/mL (ref 0.35–4.50)

## 2020-02-12 LAB — HEMOGLOBIN A1C: Hgb A1c MFr Bld: 5.4 % (ref 4.6–6.5)

## 2020-02-19 ENCOUNTER — Other Ambulatory Visit: Payer: Self-pay

## 2020-02-19 ENCOUNTER — Ambulatory Visit (INDEPENDENT_AMBULATORY_CARE_PROVIDER_SITE_OTHER): Payer: BC Managed Care – PPO | Admitting: Family Medicine

## 2020-02-19 ENCOUNTER — Encounter: Payer: Self-pay | Admitting: Family Medicine

## 2020-02-19 VITALS — BP 116/70 | HR 59 | Temp 96.8°F | Ht 63.5 in | Wt 235.4 lb

## 2020-02-19 DIAGNOSIS — G5712 Meralgia paresthetica, left lower limb: Secondary | ICD-10-CM | POA: Diagnosis not present

## 2020-02-19 DIAGNOSIS — Z0000 Encounter for general adult medical examination without abnormal findings: Secondary | ICD-10-CM

## 2020-02-19 DIAGNOSIS — R718 Other abnormality of red blood cells: Secondary | ICD-10-CM | POA: Insufficient documentation

## 2020-02-19 DIAGNOSIS — R7309 Other abnormal glucose: Secondary | ICD-10-CM

## 2020-02-19 DIAGNOSIS — E785 Hyperlipidemia, unspecified: Secondary | ICD-10-CM | POA: Insufficient documentation

## 2020-02-19 NOTE — Progress Notes (Signed)
Subjective:    Patient ID: Briana Burnett, female    DOB: [birth year], 54 y.o.   MRN: [id number]  This visit occurred during the SARS-CoV-2 public health emergency.  Safety protocols were in place, including screening questions prior to the visit, additional usage of staff PPE, and extensive cleaning of exam room while observing appropriate contact time as indicated for disinfecting solutions.    HPI Here for health maintenance exam and to review chronic medical problems    Still struggling with her shoulder (surgeries) and also leg (lat fem cut nerve inj from her surgery)  Wants to do PRP shot in shoulder and nerve (platelet rich plasma)  May eventually need a total shoulder replacement  Sees orthopedics - Dr Rennis Chris and Dr Ethelene Hal  Takes gabapentin (makes her a little loopy and has to be careful)   Did try amitriptyline and something else   Cannot lie on back in bed   Husband has lymphoma - was treated and doing better   Wt Readings from Last 3 Encounters:  02/19/20 235 lb 6 oz (106.8 kg)  05/08/19 230 lb 9 oz (104.6 kg)  01/22/19 234 lb 1 oz (106.2 kg)   41.04 kg/m   She started using a treadmill but it made pain in her leg worse  Tries to take care of herself    Tdap 4/13 Flu shot - has not had yet   On the fence about covid vaccine   Her husband had a covid vaccine  She had covid in November   HIV screen -declines   Had a hysterectomy/no longer needs a pap  It was for endometriosis - no longer goes to gyn  Does not have a cervix  No gyn issues lately   Mammogram 3/20 - she will set it up  Self breast exam - no lumps  Sister had breast ca at a young age   Colonoscopy 11/18     BP Readings from Last 3 Encounters:  02/19/20 116/70  05/08/19 122/68  01/22/19 122/76   Pulse Readings from Last 3 Encounters:  02/19/20 (!) 59  05/08/19 66  01/22/19 (!) 54     Cholesterol Lab Results  Component Value Date   CHOL 171 02/12/2020   CHOL 155 01/16/2019    CHOL 164 05/21/2015   Lab Results  Component Value Date   HDL 40.30 02/12/2020   HDL 38.10 (L) 01/16/2019   HDL 39.30 05/21/2015   Lab Results  Component Value Date   LDLCALC 117 (H) 02/12/2020   LDLCALC 99 01/16/2019   LDLCALC 109 (H) 05/21/2015   Lab Results  Component Value Date   TRIG 65.0 02/12/2020   TRIG 89.0 01/16/2019   TRIG 81.0 05/21/2015   Lab Results  Component Value Date   CHOLHDL 4 02/12/2020   CHOLHDL 4 01/16/2019   CHOLHDL 4 05/21/2015   No results found for: LDLDIRECT LDL is up - more shellfish /oysters  She is less active lately with her orthopedic problems   Other labs Results for orders placed or performed in visit on 02/12/20  TSH  Result Value Ref Range   TSH 1.50 0.35 - 4.50 uIU/mL  Lipid panel  Result Value Ref Range   Cholesterol 171 0 - 200 mg/dL   Triglycerides 38.1 0.0 - 149.0 mg/dL   HDL 01.75 >10.25 mg/dL   VLDL 85.2 0.0 - 77.8 mg/dL   LDL Cholesterol 242 (H) 0 - 99 mg/dL   Total CHOL/HDL Ratio 4    NonHDL  130.24   Hemoglobin A1c  Result Value Ref Range   Hgb A1c MFr Bld 5.4 4.6 - 6.5 %  Comprehensive metabolic panel  Result Value Ref Range   Sodium 139 135 - 145 mEq/L   Potassium 4.2 3.5 - 5.1 mEq/L   Chloride 105 96 - 112 mEq/L   CO2 31 19 - 32 mEq/L   Glucose, Bld 89 70 - 99 mg/dL   BUN 12 6 - 23 mg/dL   Creatinine, Ser 0.73 0.40 - 1.20 mg/dL   Total Bilirubin 0.4 0.2 - 1.2 mg/dL   Alkaline Phosphatase 95 39 - 117 U/L   AST 15 0 - 37 U/L   ALT 20 0 - 35 U/L   Total Protein 7.2 6.0 - 8.3 g/dL   Albumin 4.0 3.5 - 5.2 g/dL   GFR 83.22 >60.00 mL/min   Calcium 9.1 8.4 - 10.5 mg/dL  CBC with Differential/Platelet  Result Value Ref Range   WBC 6.4 4.0 - 10.5 K/uL   RBC 3.82 (L) 3.87 - 5.11 Mil/uL   Hemoglobin 12.9 12.0 - 15.0 g/dL   HCT 38.8 36.0 - 46.0 %   MCV 101.6 (H) 78.0 - 100.0 fl   MCHC 33.2 30.0 - 36.0 g/dL   RDW 13.2 11.5 - 15.5 %   Platelets 273.0 150.0 - 400.0 K/uL   Neutrophils Relative % 69.7 43.0 -  77.0 %   Lymphocytes Relative 18.0 12.0 - 46.0 %   Monocytes Relative 9.7 3.0 - 12.0 %   Eosinophils Relative 1.9 0.0 - 5.0 %   Basophils Relative 0.7 0.0 - 3.0 %   Neutro Abs 4.4 1.4 - 7.7 K/uL   Lymphs Abs 1.1 0.7 - 4.0 K/uL   Monocytes Absolute 0.6 0.1 - 1.0 K/uL   Eosinophils Absolute 0.1 0.0 - 0.7 K/uL   Basophils Absolute 0.0 0.0 - 0.1 K/uL     MCV is low   Patient Active Problem List   Diagnosis Date Noted  . Elevated glucose level 02/11/2020  . Rectal bleeding 05/08/2019  . Breast cancer screening by mammogram 01/22/2019  . Chronic right shoulder pain 01/22/2019  . Lateral femoral cutaneous neuropathy, 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  . Preoperative examination 03/27/2018  . 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 SOB (shortness of breath) 01/16/2016  . Mood change 05/21/2015  . Morbid obesity ([place]) 05/21/2015  . Nocturia 07/24/2013  . Allergic rhinitis 03/13/2012  . Hearing loss in right ear 03/13/2012  . Routine general medical examination at a health care facility 11/25/2011  . New daily persistent headache 12/18/2009  . FATIGUE 02/04/2009  . PALPITATIONS 08/16/2007   Past Medical History:  Diagnosis Date  . Allergy   . Anemia    h/o  . Complication of anesthesia   . Endometriosis   . GERD (gastroesophageal reflux disease)   . Headache   . Heart palpitations    has seen dr Rockey Situ  . Low blood pressure, not hypotension   . PONV (postoperative nausea and vomiting)    nasuea  . Stress incontinence    Past Surgical History:  Procedure Laterality Date  . ABDOMINAL HYSTERECTOMY    . BREAST EXCISIONAL BIOPSY Right    neg   . MRI  05/12/2000   brain was neg  . PLANTAR FASCIA SURGERY Left   . SHOULDER ARTHROSCOPY WITH ROTATOR CUFF REPAIR Right 04/03/2018   Procedure: Right SHOULDER ARTHROSCOPY WITH ROTATOR CUFF REPAIR, distal clavicle excision, decompression and biceps tenotomy;  Surgeon: Lovell Sheehan, MD;  Location: [facility] ORS;  Service: Orthopedics;  Laterality: Right;  .  TONSILLECTOMY     age 54   Social History   Tobacco Use  . Smoking status: Never Smoker  . Smokeless tobacco: Never Used  Substance Use Topics  . Alcohol use: No    Alcohol/week: 0.0 standard drinks  . Drug use: No   Family History  Problem Relation Age of Onset  . COPD Mother   . Pulmonary embolism Mother   . Irritable bowel syndrome Mother   . Cancer Sister [id number]       breast ca  . Breast cancer Sister [phone]  . Heart disease Father   . Mental illness Father [phone]       dementia  . COPD Father   . Alcohol abuse Father   . Cancer Maternal Aunt        breast ca  . Breast cancer Maternal Aunt 45  . Heart disease Maternal Uncle   . Crohn's disease Maternal Uncle   . Heart disease Maternal Grandmother   . Cancer Maternal Aunt        breast ca  . Breast cancer Maternal Aunt 60   Allergies  Allergen Reactions  . Celebrex [Celecoxib] Other (See Comments)    Leg pain and oral blisters   . Cortisone Other (See Comments)    Cortisone shot caused uvula swelling, blisters in throat  . Ibuprofen Other (See Comments)    Several doses causes blurred vision and light headed. PT states it only affects here if she takes several in a short amount of time.   . Meloxicam    Current Outpatient Medications on File Prior to Visit  Medication Sig Dispense Refill  . gabapentin (NEURONTIN) 100 MG capsule Take 4 capsules by mouth 2 (two) times daily.      No current facility-administered medications on file prior to visit.    Review of Systems  Constitutional: Negative for activity change, appetite change, fatigue, fever and unexpected weight change.  HENT: Negative for congestion, ear pain, rhinorrhea, sinus pressure and sore throat.   Eyes: Negative for pain, redness and visual disturbance.  Respiratory: Negative for cough, shortness of breath and wheezing.   Cardiovascular: Negative for chest pain and palpitations.  Gastrointestinal: Negative for abdominal pain, blood in stool, constipation  and diarrhea.  Endocrine: Negative for polydipsia and polyuria.  Genitourinary: Negative for dysuria, frequency and urgency.  Musculoskeletal: Positive for arthralgias, back pain and gait problem. Negative for joint swelling and myalgias.  Skin: Negative for pallor and rash.  Allergic/Immunologic: Negative for environmental allergies.  Neurological: Negative for dizziness, syncope and headaches.  Hematological: Negative for adenopathy. Does not bruise/bleed easily.  Psychiatric/Behavioral: Negative for decreased concentration and dysphoric mood. The patient is not nervous/anxious.        Stressors        Objective:   Physical Exam Constitutional:      General: She is not in acute distress.    Appearance: Normal appearance. She is well-developed. She is obese. She is not ill-appearing or diaphoretic.  HENT:     Head: Normocephalic and atraumatic.     Right Ear: Tympanic membrane, ear canal and external ear normal.     Left Ear: Tympanic membrane, ear canal and external ear normal.     Nose: Nose normal. No congestion.     Mouth/Throat:     Mouth: Mucous membranes are moist.     Pharynx: Oropharynx is clear. No posterior oropharyngeal erythema.  Eyes:     General: No  scleral icterus.    Extraocular Movements: Extraocular movements intact.     Conjunctiva/sclera: Conjunctivae normal.     Pupils: Pupils are equal, round, and reactive to light.  Neck:     Thyroid: No thyromegaly.     Vascular: No carotid bruit or JVD.  Cardiovascular:     Rate and Rhythm: Normal rate and regular rhythm.     Pulses: Normal pulses.     Heart sounds: Normal heart sounds. No gallop.   Pulmonary:     Effort: Pulmonary effort is normal. No respiratory distress.     Breath sounds: Normal breath sounds. No wheezing.     Comments: Good air exch Chest:     Chest wall: No tenderness.  Abdominal:     General: Bowel sounds are normal. There is no distension or abdominal bruit.     Palpations: Abdomen is  soft. There is no mass.     Tenderness: There is no abdominal tenderness.     Hernia: No hernia is present.  Genitourinary:    Comments: Breast exam: No mass, nodules, thickening, tenderness, bulging, retraction, inflamation, nipple discharge or skin changes noted.  No axillary or clavicular LA.     Musculoskeletal:        General: No tenderness. Normal range of motion.     Cervical back: Normal range of motion and neck supple. No rigidity. No muscular tenderness.     Right lower leg: No edema.     Left lower leg: No edema.     Comments: Limited rom of R shoulder and L leg /hip  Uncomfortable on table so stands for much of exam  No kyphosis   Lymphadenopathy:     Cervical: No cervical adenopathy.  Skin:    General: Skin is warm and dry.     Coloration: Skin is not pale.     Findings: No erythema or rash.     Comments: Solar lentigines diffusely   Neurological:     Mental Status: She is alert. Mental status is at baseline.     Cranial Nerves: No cranial nerve deficit.     Motor: No abnormal muscle tone.     Coordination: Coordination normal.     Gait: Gait normal.     Deep Tendon Reflexes: Reflexes are normal and symmetric. Reflexes normal.  Psychiatric:        Mood and Affect: Mood normal.        Cognition and Memory: Cognition and memory normal.     Comments: Voices frustration over chronic medical problem            Assessment & Plan:   Problem List Items Addressed This Visit      Nervous and Auditory   Lateral femoral cutaneous neuropathy, left    This has been more severe/ caused by a prior surgery Followed by orthopedics Considering platelet rich plasma injection  Taking gabapentin Limiting mobility and ability to exercise         Other   Routine general medical examination at a health care facility - Primary    Reviewed health habits including diet and exercise and skin cancer prevention Reviewed appropriate screening tests for age  Also reviewed health  mt list, fam hx and immunization status , as well as social and family history   See HPI Labs reviewed Obesity is threat to health/ wt loss would be optimal  Enc her to get a covid vaccine  Pt plans to schedule mammogram this mo  Disc better diet  for cholesterol  Also B12 intake for elevated MCV         Morbid obesity (HCC)    Discussed how this problem influences overall health and the risks it imposes  Reviewed plan for weight loss with lower calorie diet (via better food choices and also portion control or program like weight watchers) and exercise building up to or more than 30 minutes 5 days per week including some aerobic activity   Pt is limited re: exercise due to chronic shoulder and let pain       Elevated glucose level    Lab Results  Component Value Date   HGBA1C 5.4 02/12/2020   disc imp of low glycemic diet and wt loss to prevent DM2

## 2020-02-19 NOTE — Assessment & Plan Note (Signed)
Discussed how this problem influences overall health and the risks it imposes  Reviewed plan for weight loss with lower calorie diet (via better food choices and also portion control or program like weight watchers) and exercise building up to or more than 30 minutes 5 days per week including some aerobic activity   Pt is limited re: exercise due to chronic shoulder and let pain

## 2020-02-19 NOTE — Assessment & Plan Note (Signed)
Reviewed health habits including diet and exercise and skin cancer prevention Reviewed appropriate screening tests for age  Also reviewed health mt list, fam hx and immunization status , as well as social and family history   See HPI Labs reviewed Obesity is threat to health/ wt loss would be optimal  Enc her to get a covid vaccine  Pt plans to schedule mammogram this mo  Disc better diet for cholesterol  Also B12 intake for elevated MCV

## 2020-02-19 NOTE — Assessment & Plan Note (Signed)
This has been more severe/ caused by a prior surgery Followed by orthopedics Considering platelet rich plasma injection  Taking gabapentin Limiting mobility and ability to exercise

## 2020-02-19 NOTE — Assessment & Plan Note (Signed)
Disc goals for lipids and reasons to control them Rev last labs with pt Rev low sat fat diet in detail LDL is 117- up from previous  Enc to watch diet  Limited exercise unfortunately

## 2020-02-19 NOTE — Patient Instructions (Addendum)
I think it is wise to get your covid vaccines   MCV high- you may need more B12  Take a B12 supplement daily   - 500 mcg   Cholesterol is up a bit  Avoid red meat/ fried foods/ egg yolks/ fatty breakfast meats/ butter, cheese and high fat dairy/ and shellfish    Try to eat a healthy diet with limited processed food for better health and diabetes prevention  Try to get most of your carbohydrates from produce (with the exception of white potatoes)  Eat less bread/pasta/rice/snack foods/cereals/sweets and other items from the middle of the grocery store (processed carbs)

## 2020-02-19 NOTE — Assessment & Plan Note (Signed)
Lab Results  Component Value Date   HGBA1C 5.4 02/12/2020   disc imp of low glycemic diet and wt loss to prevent DM2

## 2020-03-31 ENCOUNTER — Other Ambulatory Visit: Payer: Self-pay | Admitting: Family Medicine

## 2020-03-31 DIAGNOSIS — Z1231 Encounter for screening mammogram for malignant neoplasm of breast: Secondary | ICD-10-CM

## 2020-04-02 ENCOUNTER — Ambulatory Visit
Admission: RE | Admit: 2020-04-02 | Discharge: 2020-04-02 | Disposition: A | Discharge status: Home / Self Care | Payer: BC Managed Care – PPO | Source: Ambulatory Visit | Attending: Family Medicine | Admitting: Family Medicine

## 2020-04-02 DIAGNOSIS — Z1231 Encounter for screening mammogram for malignant neoplasm of breast: Secondary | ICD-10-CM

## 2020-04-04 ENCOUNTER — Other Ambulatory Visit: Payer: Self-pay | Admitting: Family Medicine

## 2020-04-04 DIAGNOSIS — N6310 Unspecified lump in the right breast, unspecified quadrant: Secondary | ICD-10-CM

## 2020-04-14 ENCOUNTER — Ambulatory Visit
Admission: RE | Admit: 2020-04-14 | Discharge: 2020-04-14 | Disposition: A | Discharge status: Home / Self Care | Payer: BC Managed Care – PPO | Source: Ambulatory Visit | Attending: Family Medicine | Admitting: Family Medicine

## 2020-04-14 DIAGNOSIS — N6310 Unspecified lump in the right breast, unspecified quadrant: Secondary | ICD-10-CM | POA: Insufficient documentation

## 2020-09-13 NOTE — Progress Notes (Signed)
Cardiology Office Note  Date:  09/15/2020   ID:  Briana Burnett, DOB 05/04/1966, MRN [id number]  PCP:  Briana Pimple, MD   Chief Complaint  Patient presents with  . Other    Past due follow up - Patient states she thinks Gabipentin is making her have palpitations. meds reviewed verbally with patient.     HPI:  Briana Burnett  Is a 54 year old woman with history of  obesity,  palpitations /PVCs who presents for f/u of her shortness of breath, palpitations/tachycardia/PVCs  Last seen in clinic January 2019 Had covid in 08/2019 Whole family got it  On gabapentin for leg pain, 500 BID Wonders if it is causing PVCs  symptomatic PVCs, uses her iwatch/phone to monitor  With PVCs, some SOB Prefers no b-blockers BP runs low  Weight 234  Labs reviewed: HBA1c : 5.4 Total chol 171  No exercise , limited by orthopedic issues, nerve issue   rare episode of tachycardia , paroxysmal Typically Resolve without intervention  No exercise program  EKG personally reviewed by myself on todays visit Shows NSR rate 69 no ST or T wave changes  Other past medical hx reviewed history of palpitations dating back many years  Workup in the past included a Holter monitor, echocardiogram.  Previously given propranolol, did not try this, is afraid blood pressure would drop low   November 2016 she was going to work, got very upset with her " animals" " very upset".  She  Developed palpitations, tachycardia , had to sit down and recover.  Blood pressure was elevated , finally recovered to the point where she was able to drive to work.  At work she was not thinking clearly, continue to have high blood pressure.  Husband came and picked her up , went to the emergency room , was checked out and was told that everything looked okay    PMH:   has a past medical history of Allergy, Anemia, Complication of anesthesia, Endometriosis, GERD (gastroesophageal reflux disease), Headache, Heart  palpitations, Low blood pressure, not hypotension, PONV (postoperative nausea and vomiting), and Stress incontinence.  PSH:    Past Surgical History:  Procedure Laterality Date  . ABDOMINAL HYSTERECTOMY    . BREAST EXCISIONAL BIOPSY Right    neg   . MRI  05/12/2000   brain was neg  . PLANTAR FASCIA SURGERY Left   . SHOULDER ARTHROSCOPY WITH ROTATOR CUFF REPAIR Right 04/03/2018   Procedure: Right SHOULDER ARTHROSCOPY WITH ROTATOR CUFF REPAIR, distal clavicle excision, decompression and biceps tenotomy;  Surgeon: Briana Herrlich, MD;  Location: [facility] ORS;  Service: Orthopedics;  Laterality: Right;  . TONSILLECTOMY     age 50    Current Outpatient Medications  Medication Sig Dispense Refill  . gabapentin (NEURONTIN) 100 MG capsule Take 5 capsules by mouth 2 (two) times daily.     Marland Kitchen omeprazole (PRILOSEC) 20 MG capsule Take 20 mg by mouth daily.     No current facility-administered medications for this visit.    Allergies:   Celebrex [celecoxib], Cortisone, Ibuprofen, and Meloxicam   Social History:  The patient  reports that she has never smoked. She has never used smokeless tobacco. She reports that she does not drink alcohol and does not use drugs.   Family History:   family history includes Alcohol abuse in her father; Breast cancer (age of onset: 12) in her sister; Breast cancer (age of onset: 32) in her maternal aunt; Breast cancer (age of onset: 15) in her maternal  aunt; COPD in her father and mother; Cancer in her maternal aunt and maternal aunt; Cancer (age of onset: 62) in her sister; Crohn's disease in her maternal uncle; Heart disease in her father, maternal grandmother, and maternal uncle; Irritable bowel syndrome in her mother; Mental illness (age of onset: 75) in her father; Pulmonary embolism in her mother.   Review of Systems: Review of Systems  Constitutional: Negative.   Respiratory: Negative.   Cardiovascular: Positive for palpitations.  Gastrointestinal: Negative.    Musculoskeletal: Negative.   Neurological: Negative.   Psychiatric/Behavioral: Negative.   All other systems reviewed and are negative.   PHYSICAL EXAM: VS:  BP 120/80 (BP Location: Left Arm, Patient Position: Sitting, Cuff Size: Normal)   Pulse 69   Ht 5\' 4"  (1.626 m)   Wt 234 lb (106.1 kg)   BMI 40.17 kg/m  , BMI Body mass index is 40.17 kg/m. Constitutional:  oriented to person, place, and time. No distress.  HENT:  Head: Grossly normal Eyes:  no discharge. No scleral icterus.  Neck: No JVD, no carotid bruits  Cardiovascular: Regular rate and rhythm, no murmurs appreciated Pulmonary/Chest: Clear to auscultation bilaterally, no wheezes or rails Abdominal: Soft.  no distension.  no tenderness.  Musculoskeletal: Normal range of motion Neurological:  normal muscle tone. Coordination normal. No atrophy Skin: Skin warm and dry Psychiatric: normal affect, pleasant   Recent Labs: 02/12/2020: ALT 20; BUN 12; Creatinine, Ser 0.73; Hemoglobin 12.9; Platelets 273.0; Potassium 4.2; Sodium 139; TSH 1.50    Lipid Panel Lab Results  Component Value Date   CHOL 171 02/12/2020   HDL 40.30 02/12/2020   LDLCALC 117 (H) 02/12/2020   TRIG 65.0 02/12/2020      Wt Readings from Last 3 Encounters:  09/15/20 234 lb (106.1 kg)  02/19/20 235 lb 6 oz (106.8 kg)  05/08/19 230 lb 9 oz (104.6 kg)       ASSESSMENT AND PLAN:  SOB (shortness of breath) - Plan: EKG 12-Lead We have encouraged continued exercise, careful diet management in an effort to lose weight.  Palpitations - Plan: EKG 12-Lead Secondary to PVCs as below If sx get worse, could try b-blocker Frequent PVCs  Morbid obesity (HCC) We have encouraged continued exercise, careful diet management in an effort to lose weight.  Headaches Etiology unclear, likely not from PVCs Recommend follow-up with primary care She does have paravertebral muscle spasms, consider massage She is taking Excedrin daily. Discussed long-term  consequences of aspirin  Tachycardia Concerning for short run of atrial tachycardia or SVT Very rare, does not want medications stable  Disposition:   F/U  as needed    Total encounter time more than 25 minutes  Greater than 50% was spent in counseling and coordination of care with the patient    No orders of the defined types were placed in this encounter.    Signed, 07/08/19, M.D., Ph.D. 09/15/2020  [facility] [place], [place]

## 2020-09-15 ENCOUNTER — Other Ambulatory Visit: Payer: Self-pay

## 2020-09-15 ENCOUNTER — Ambulatory Visit: Payer: BC Managed Care – PPO | Admitting: Cardiovascular Disease

## 2020-09-15 ENCOUNTER — Encounter: Payer: Self-pay | Admitting: Cardiovascular Disease

## 2020-09-15 DIAGNOSIS — I493 Ventricular premature depolarization: Secondary | ICD-10-CM

## 2020-09-15 DIAGNOSIS — R0602 Shortness of breath: Secondary | ICD-10-CM

## 2020-09-15 DIAGNOSIS — E785 Hyperlipidemia, unspecified: Secondary | ICD-10-CM | POA: Diagnosis not present

## 2020-09-15 NOTE — Patient Instructions (Signed)
Medication Instructions:  No changes  If you need a refill on your cardiac medications before your next appointment, please call your pharmacy.    Lab work: No new labs needed   If you have labs (blood work) drawn today and your tests are completely normal, you will receive your results only by: . MyChart Message (if you have MyChart) OR . A paper copy in the mail If you have any lab test that is abnormal or we need to change your treatment, we will call you to review the results.   Testing/Procedures: No new testing needed   Follow-Up: At [facility], you and your health needs are our priority.  As part of our continuing mission to provide you with exceptional heart care, we have created designated Provider Care Teams.  These Care Teams include your primary Cardiologist (physician) and Advanced Practice Providers (APPs -  Physician Assistants and Nurse Practitioners) who all work together to provide you with the care you need, when you need it.  . You will need a follow up appointment as needed  . Providers on your designated Care Team:   . Christopher Berge, NP . Ryan Dunn, PA-C . Jacquelyn Visser, PA-C  Any Other Special Instructions Will Be Listed Below (If Applicable).  COVID-19 Vaccine Information can be found at: [email or web address] For questions related to vaccine distribution or appointments, please email [email or web address] or call [phone].     

## 2021-04-15 ENCOUNTER — Encounter: Payer: Self-pay | Admitting: Podiatry

## 2021-04-15 ENCOUNTER — Ambulatory Visit: Payer: BC Managed Care – PPO | Admitting: Podiatry

## 2021-04-15 ENCOUNTER — Other Ambulatory Visit: Payer: Self-pay

## 2021-04-15 DIAGNOSIS — B070 Plantar wart: Secondary | ICD-10-CM | POA: Diagnosis not present

## 2021-04-15 MED ORDER — FLUOROURACIL 5 % EX CREA: TOPICAL_CREAM | Freq: Two times a day (BID) | CUTANEOUS | 1 refills | Status: DC

## 2021-04-19 NOTE — Progress Notes (Signed)
Subjective:  Patient ID: Briana Burnett, female    DOB: 07-06-1966,  MRN: [id number] HPI Chief Complaint  Patient presents with  . Foot Pain    Sub 5th met left - multiple callused lesions x 1 year, burning when walking, tried medicated patches-no help  . New Patient (Initial Visit)    Est pt 2018    55 y.o. female presents with the above complaint.   ROS: Denies fever chills nausea vomiting muscle aches pains calf pain back pain chest pain shortness of breath.  Past Medical History:  Diagnosis Date  . Allergy   . Anemia    h/o  . Complication of anesthesia   . Endometriosis   . GERD (gastroesophageal reflux disease)   . Headache   . Heart palpitations    has seen dr Rockey Situ  . Low blood pressure, not hypotension   . PONV (postoperative nausea and vomiting)    nasuea  . Stress incontinence    Past Surgical History:  Procedure Laterality Date  . ABDOMINAL HYSTERECTOMY    . BREAST EXCISIONAL BIOPSY Right    neg   . MRI  05/12/2000   brain was neg  . PLANTAR FASCIA SURGERY Left   . SHOULDER ARTHROSCOPY WITH ROTATOR CUFF REPAIR Right 04/03/2018   Procedure: Right SHOULDER ARTHROSCOPY WITH ROTATOR CUFF REPAIR, distal clavicle excision, decompression and biceps tenotomy;  Surgeon: Lovell Sheehan, MD;  Location: [facility] ORS;  Service: Orthopedics;  Laterality: Right;  . TONSILLECTOMY     age 57    Current Outpatient Medications:  .  fluorouracil (EFUDEX) 5 % cream, Apply topically 2 (two) times daily., Disp: 40 g, Rfl: 1 .  gabapentin (NEURONTIN) 100 MG capsule, Take 5 capsules by mouth 2 (two) times daily. , Disp: , Rfl:  .  omeprazole (PRILOSEC) 20 MG capsule, Take 20 mg by mouth daily., Disp: , Rfl:  .  traMADol (ULTRAM) 50 MG tablet, Take 50 mg by mouth every 8 (eight) hours as needed., Disp: , Rfl:   Allergies  Allergen Reactions  . Nsaids     Other reaction(s): Other Blisters in mouth, blurred vision  . Celebrex [Celecoxib] Other (See Comments)    Leg pain and  oral blisters   . Cortisone Other (See Comments)    Cortisone shot caused uvula swelling, blisters in throat  . Ibuprofen Other (See Comments)    Several doses causes blurred vision and light headed. PT states it only affects here if she takes several in a short amount of time.   . Meloxicam    Review of Systems Objective:  There were no vitals filed for this visit.  General: Well developed, nourished, in no acute distress, alert and oriented x3   Dermatological: Skin is warm, dry and supple bilateral. Nails x 10 are well maintained; remaining integument appears unremarkable at this time. There are no open sores, no preulcerative lesions, no rash or signs of infection present.  Lesion to the plantar aspect of the fifth metatarsal phalangeal joint area does demonstrate what appears to be a verrucoid lesion with thrombosed capillaries and skin lines that circumvent the lesion.  Consistent with a wart.  Vascular: Dorsalis Pedis artery and Posterior Tibial artery pedal pulses are 2/4 bilateral with immedate capillary fill time. Pedal hair growth present. No varicosities and no lower extremity edema present bilateral.   Neruologic: Grossly intact via light touch bilateral. Vibratory intact via tuning fork bilateral. Protective threshold with Semmes Wienstein monofilament intact to all pedal sites bilateral. Patellar  and Achilles deep tendon reflexes 2+ bilateral. No Babinski or clonus noted bilateral.   Musculoskeletal: No gross boney pedal deformities bilateral. No pain, crepitus, or limitation noted with foot and ankle range of motion bilateral. Muscular strength 5/5 in all groups tested bilateral.  Gait: Unassisted, Nonantalgic.    Radiographs:  None taken  Assessment & Plan:   Assessment: Verrucoid lesion left foot  Plan: Chemical destruction lesion today with Cantharone under occlusion to be washed off thoroughly tomorrow she will also start Efudex cream, follow-up with her in 6 weeks  consider surgical excision of lesion.     [place] Silveria T. [place], [place]

## 2021-05-27 ENCOUNTER — Ambulatory Visit: Payer: BC Managed Care – PPO | Admitting: Podiatry

## 2021-05-27 ENCOUNTER — Other Ambulatory Visit: Payer: Self-pay

## 2021-05-27 ENCOUNTER — Encounter: Payer: Self-pay | Admitting: Podiatry

## 2021-05-27 DIAGNOSIS — B070 Plantar wart: Secondary | ICD-10-CM | POA: Diagnosis not present

## 2021-05-27 NOTE — Progress Notes (Signed)
She presents today for follow-up of her plantar wart states that they seem to be getting a little better she continues to use the Efudex cream twice daily.  Objective: Vital signs are stable alert and oriented x3.  Pulses are palpable.  Verrucoid lesions multiple #2 the subfifth metatarsal head area of the left foot appear to be demonstrating a much more superficial appearance.  Thrombosed capillaries are visible but they are not as nearly as thickened as hard as they have been in the past.  Assessment: I think that the verruca plantaris is slowly resolving plantar aspect left foot.  Plan: Chemical destruction of the lesion was placed today with Cantharone under occlusion to be washed off thoroughly tomorrow I will recommend that she continue the Efudex cream twice daily covered and I will follow-up with her in 6 to 8 weeks.

## 2021-06-04 ENCOUNTER — Telehealth: Payer: Self-pay

## 2021-06-04 MED ORDER — FLUOROURACIL 5 % EX CREA: TOPICAL_CREAM | Freq: Two times a day (BID) | CUTANEOUS | 1 refills | Status: DC

## 2021-06-04 NOTE — Telephone Encounter (Signed)
Pt would like refill for fluorouracil (EFUDEX) 5 % cream sent to [facility] [address]

## 2021-06-04 NOTE — Telephone Encounter (Signed)
Refill sent to pharmacy.   

## 2021-07-08 ENCOUNTER — Ambulatory Visit: Payer: BC Managed Care – PPO | Admitting: Podiatry

## 2021-07-23 ENCOUNTER — Telehealth: Payer: Self-pay

## 2021-07-23 ENCOUNTER — Other Ambulatory Visit: Payer: Self-pay

## 2021-07-23 ENCOUNTER — Ambulatory Visit
Admission: RE | Admit: 2021-07-23 | Discharge: 2021-07-23 | Disposition: A | Discharge status: Home / Self Care | Payer: BC Managed Care – PPO | Source: Ambulatory Visit | Attending: Family Medicine | Admitting: Family Medicine

## 2021-07-23 VITALS — BP 112/70 | HR 77 | Temp 98.8°F | Resp 16

## 2021-07-23 DIAGNOSIS — U071 COVID-19: Secondary | ICD-10-CM | POA: Diagnosis not present

## 2021-07-23 LAB — BASIC METABOLIC PANEL
Anion gap: 6 (ref 5–15)
BUN: 11 mg/dL (ref 6–20)
CO2: 27 mmol/L (ref 22–32)
Calcium: 8.9 mg/dL (ref 8.9–10.3)
Chloride: 104 mmol/L (ref 98–111)
Creatinine, Ser: 0.68 mg/dL (ref 0.44–1.00)
GFR, Estimated: >60 mL/min (ref >60)
Glucose, Bld: 102 mg/dL — ABNORMAL HIGH (ref 70–99)
Potassium: 4.1 mmol/L (ref 3.5–5.1)
Sodium: 137 mmol/L (ref 135–145)

## 2021-07-23 MED ORDER — NIRMATRELVIR/RITONAVIR (PAXLOVID)TABLET: 3.0000 | ORAL_TABLET | Freq: Two times a day (BID) | ORAL | 0 refills | Status: AC

## 2021-07-23 MED ORDER — PROMETHAZINE-DM 6.25-15 MG/5ML PO SYRP: 5.0000 mL | ORAL_SOLUTION | Freq: Four times a day (QID) | ORAL | 0 refills | Status: DC | PRN

## 2021-07-23 NOTE — ED Triage Notes (Signed)
Symptoms started Sunday. Tested positive for covid Tuesday. Her PCP made her an UC appointment for the cough to be evaluated.

## 2021-07-23 NOTE — Telephone Encounter (Signed)
I spoke with pt; covid + on 07/21/21; on 07/19/21 symptoms started with dry cough which is now prod cough with greens phlegm, pt has a lot of chest and head congestion, fever 100, bodyaches, chills, very raw S/T, when blows nose has green mucus.,h/a at pain level 4, and runny nose; pt thinks has lost sense of taste and smell. Pt has been taking ibuprofen for fever and tussin DM that helps little bit with cough but pt is not able to sleep at night due to cough. Pt does not have CP,SOB, vomiting or diarrhea. I scheduled pt appt at [facility] [place] 07/23/21 at 12 noon; pt will be there at 11:45. Pt is self quarantining, trying to drink fluids, rest and taking ibuprofen for fever and body aches. UC & ED precautions given and pt voiced understanding. Sending note to Dr Milinda Antis and [place] CMA. Will send teams to [facility] CMA as well.

## 2021-07-23 NOTE — ED Provider Notes (Signed)
[facility]    CSN: [id number] Arrival date & time: 07/23/21  1143      History   Chief Complaint Chief Complaint  Patient presents with   Appointment    COVID positive    HPI 55 year old female presents with the above complaint.  Patient reports that she tested positive for COVID on Tuesday.  She has had symptoms since Tuesday.  She reports fever, headache, runny nose, back pain, generalized weakness, and cough.  She feels very poorly.  She is currently afebrile.  She was advised by her primary care physician to come in for evaluation.  No relieving factors.  No other complaints.  Past Medical History:  Diagnosis Date   Allergy    Anemia    h/o   Complication of anesthesia    Endometriosis    GERD (gastroesophageal reflux disease)    Headache    Heart palpitations    has seen dr Mariah Milling   Low blood pressure, not hypotension    PONV (postoperative nausea and vomiting)    nasuea   Stress incontinence     Patient Active Problem List   Diagnosis Date Noted   Elevated MCV 02/19/2020   Mild hyperlipidemia 02/19/2020   Elevated glucose level 02/11/2020   Rectal bleeding 05/08/2019   Breast cancer screening by mammogram 01/22/2019   Chronic right shoulder pain 01/22/2019   Lateral femoral cutaneous neuropathy, 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   Preoperative examination 03/27/2018   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   SOB (shortness of breath) 01/16/2016   Mood change 05/21/2015   Morbid obesity (HCC) 05/21/2015   Nocturia 07/24/2013   Allergic rhinitis 03/13/2012   Hearing loss in right ear 03/13/2012   Routine general medical examination at a health care facility 11/25/2011   New daily persistent headache 12/18/2009   FATIGUE 02/04/2009   PALPITATIONS 08/16/2007    Past Surgical History:  Procedure Laterality Date   ABDOMINAL HYSTERECTOMY     BREAST EXCISIONAL BIOPSY Right    neg    MRI  05/12/2000   brain was neg   PLANTAR FASCIA SURGERY Left    SHOULDER ARTHROSCOPY  WITH ROTATOR CUFF REPAIR Right 04/03/2018   Procedure: Right SHOULDER ARTHROSCOPY WITH ROTATOR CUFF REPAIR, distal clavicle excision, decompression and biceps tenotomy;  Surgeon: Lyndle Herrlich, MD;  Location: [facility] ORS;  Service: Orthopedics;  Laterality: Right;   TONSILLECTOMY     age 36    OB History   No obstetric history on file.      Home Medications    Prior to Admission medications   Medication Sig Start Date End Date Taking? Authorizing Provider  gabapentin (NEURONTIN) 100 MG capsule Take 5 capsules by mouth 2 (two) times daily.  12/19/18  Yes [provider]  nirmatrelvir/ritonavir EUA (PAXLOVID) 20 x 150 MG & 10 x 100MG  TABS Take 3 tablets by mouth 2 (two) times daily for 5 days. Patient GFR is >60. Take nirmatrelvir (150 mg) two tablets twice daily for 5 days and ritonavir (100 mg) one tablet twice daily for 5 days. 07/23/21 07/28/21 Yes Javonte Elenes G, DO  omeprazole (PRILOSEC) 20 MG capsule Take 20 mg by mouth daily.   Yes [provider]  promethazine-dextromethorphan (PROMETHAZINE-DM) 6.25-15 MG/5ML syrup Take 5 mLs by mouth 4 (four) times daily as needed for cough. 07/23/21  Yes Amir Fick G, DO  traMADol (ULTRAM) 50 MG tablet Take 50 mg by mouth every 8 (eight) hours as needed. 03/06/21  Yes [provider]  fluorouracil (EFUDEX) 5 %  cream Apply topically 2 (two) times daily. 06/04/21   Hyatt, Max T, DPM    Family History Family History  Problem Relation Age of Onset   COPD Mother    Pulmonary embolism Mother    Irritable bowel syndrome Mother    Cancer Sister 15       breast ca   Breast cancer Sister 45   Heart disease Father    Mental illness Father 81       dementia   COPD Father    Alcohol abuse Father    Cancer Maternal Aunt        breast ca   Breast cancer Maternal Aunt 45   Heart disease Maternal Uncle    Crohn's disease Maternal Uncle    Heart disease Maternal Grandmother    Cancer Maternal Aunt        breast ca   Breast  cancer Maternal Aunt 15    Social History Social History   Tobacco Use   Smoking status: Never   Smokeless tobacco: Never  Vaping Use   Vaping Use: Never used  Substance Use Topics   Alcohol use: No    Alcohol/week: 0.0 standard drinks   Drug use: No     Allergies   Nsaids, Celebrex [celecoxib], Cortisone, Ibuprofen, and Meloxicam   Review of Systems Review of Systems Per HPI  Physical Exam Triage Vital Signs ED Triage Vitals  Enc Vitals Group     BP 07/23/21 1157 112/70     Pulse Rate 07/23/21 1157 77     Resp 07/23/21 1157 16     Temp 07/23/21 1157 98.8 F (37.1 C)     Temp Source 07/23/21 1157 Oral     SpO2 07/23/21 1157 96 %     Weight --      Height --      Head Circumference --      Peak Flow --      Pain Score 07/23/21 1155 6     Pain Loc --      Pain Edu? --      Excl. in GC? --    Updated Vital Signs BP 112/70   Pulse 77   Temp 98.8 F (37.1 C) (Oral)   Resp 16   SpO2 96%   Visual Acuity Right Eye Distance:   Left Eye Distance:   Bilateral Distance:    Right Eye Near:   Left Eye Near:    Bilateral Near:     Physical Exam Vitals and nursing note reviewed.  Constitutional:      General: She is not in acute distress.    Appearance: She is not ill-appearing.  HENT:     Head: Normocephalic and atraumatic.  Eyes:     General:        Right eye: No discharge.        Left eye: No discharge.     Conjunctiva/sclera: Conjunctivae normal.  Cardiovascular:     Rate and Rhythm: Normal rate and regular rhythm.  Pulmonary:     Effort: Pulmonary effort is normal.     Breath sounds: Normal breath sounds. No wheezing, rhonchi or rales.  Neurological:     Mental Status: She is alert.  Psychiatric:        Mood and Affect: Mood normal.        Behavior: Behavior normal.     UC Treatments / Results  Labs (all labs ordered are listed, but only abnormal results are displayed) Labs Reviewed  BASIC METABOLIC PANEL - Abnormal; Notable for the  following components:      Result Value   Glucose, Bld 102 (*)    All other components within normal limits    EKG   Radiology No results found.  Procedures Procedures (including critical care time)  Medications Ordered in UC Medications - No data to display  Initial Impression / Assessment and Plan / UC Course  I have reviewed the triage vital signs and the nursing notes.  Pertinent labs & imaging results that were available during my care of the patient were reviewed by me and considered in my medical decision making (see chart for details).    55 year old female presents with COVID-19.  Acute illness with systemic symptoms.  BMP obtained and revealed normal renal function.  Treating with Paxlovid.  Promethazine DM for cough.  Final Clinical Impressions(s) / UC Diagnoses   Final diagnoses:  COVID     Discharge Instructions      Rest, fluids.  Medication as prescribed.  Quarantine for 5 days.  Take care  Dr. Adriana Simas    ED Prescriptions     Medication Sig Dispense Auth. Provider   nirmatrelvir/ritonavir EUA (PAXLOVID) 20 x 150 MG & 10 x 100MG  TABS Take 3 tablets by mouth 2 (two) times daily for 5 days. Patient GFR is >60. Take nirmatrelvir (150 mg) two tablets twice daily for 5 days and ritonavir (100 mg) one tablet twice daily for 5 days. 30 tablet Bryssa Tones G, DO   promethazine-dextromethorphan (PROMETHAZINE-DM) 6.25-15 MG/5ML syrup Take 5 mLs by mouth 4 (four) times daily as needed for cough. 118 mL 02-07-1991, DO      PDMP not reviewed this encounter.   Tommie Sams, Tommie Sams 07/23/21 1336

## 2021-07-23 NOTE — Telephone Encounter (Signed)
[facility] Night - Client TELEPHONE ADVICE RECORD AccessNurse Patient Name: Briana Burnett Gender: Female DOB: 1965-12-11 Age: 55 Y 2 D Return Phone Number: [phone] (Primary) Address: City/ State/ Zip: [place] [address] Client [facility] Night - Client Client Site [facility] - Night Physician [place], Idamae Schuller - MD Contact Type Call Who Is Calling Patient / Member / Family / Caregiver Call Type Triage / Clinical Relationship To Patient Self Return Phone Number [phone] (Primary) Chief Complaint Cough Reason for Call Symptomatic / Request for Health Information Initial Comment Caller states she has COVID. States she is having a constant cough. Wants to know if something can be called in. Translation No Nurse Assessment Nurse: Letta Pate, RN, Ana Date/Time (Eastern Time): 07/22/2021 5:22:35 PM Confirm and document reason for call. If symptomatic, describe symptoms. ---Caller states she has COVID. States she is having a constant cough. Symptoms started yesterday morning, has using cough drops and daytime nyquil and has not has any relief. Denies vomit or diarrhea, has headache, cough, low back pain, highest temperature is 100 (forehead). Wants to know if something can be called in. Does the patient have any new or worsening symptoms? ---Yes Will a triage be completed? ---Yes Related visit to physician within the last 2 weeks? ---No Does the PT have any chronic conditions? (i.e. diabetes, asthma, this includes High risk factors for pregnancy, etc.) ---No Is this a behavioral health or substance abuse call? ---No Guidelines Guideline Title Affirmed Question Affirmed Notes Nurse Date/Time (Eastern Time) COVID-19 - Diagnosed or Suspected [1] Continuous (nonstop) coughing interferes with work or school AND [2] no improvement using cough treatment per Care Advice Letta Pate, RN, Ana  07/22/2021 5:25:05 PM PLEASE NOTE: All timestamps contained within this report are represented as [place] Standard Time. CONFIDENTIALTY NOTICE: This fax transmission is intended only for the addressee. It contains information that is legally privileged, confidential or otherwise protected from use or disclosure. If you are not the intended recipient, you are strictly prohibited from reviewing, disclosing, copying using or disseminating any of this information or taking any action in reliance on or regarding this information. If you have received this fax in error, please notify us immediately by telephone so that we can arrange for its return to [place]. Phone: [phone], Toll-Free: [phone], Fax: [phone] Page: 2 of 2 Call Id: [id number] Disp. Time Lamount Cohen Time) Disposition Final User 07/22/2021 5:29:43 PM Call PCP within 24 Hours Yes Letta Pate, RN, Darien Ramus Caller Disagree/Comply Comply Caller Understands Yes PreDisposition Call Doctor Care Advice Given Per Guideline * COUGH SYRUP WITH DEXTROMETHORPHAN: An over-the-counter cough syrup can help your cough. The most common cough suppressant in over-the-counter cough medicines is dextromethorphan. COUGH MEDICINES: * HOME REMEDY - HONEY: This old home remedy has been shown to help decrease coughing at night. The adult dosage is 2 teaspoons (10 ml) at bedtime. CALL BACK IF: * Fever over 103 F (39.4 C) * Chest pain or difficulty breathing occurs * You become worse CALL PCP WITHIN 24 HOURS: * IF OFFICE WILL BE OPEN: Call the office when it opens tomorrow morning. COUGH SYRUP WITH DEXTROMETHORPHAN - EXTRA NOTES AND WARNINGS: * Do not try to completely stop coughs that produce mucus and phlegm. * Coughing is helpful. It brings up the mucus from the lungs and helps prevent pneumonia. CARE ADVICE given per COVID-19 - DIAGNOSED OR SUSPECTED (Adult) guideline.

## 2021-07-23 NOTE — Discharge Instructions (Addendum)
Rest, fluids.  Medication as prescribed.  Quarantine for 5 days.  Take care  Dr. Adriana Simas

## 2021-07-23 NOTE — Telephone Encounter (Signed)
Agree with that advisement  Aware, will watch for correspondence  Please check in with her tomorrow

## 2021-07-24 NOTE — Telephone Encounter (Signed)
Aware, great  Thanks  Will continue to follow and call if worse

## 2021-07-24 NOTE — Telephone Encounter (Signed)
Spoke with patient. Patient is doing much better than she did yesterday. She went to [facility] on 07/23/21-was started on paxlovid and was given cough medicine. Nothing is worse and overall doing better. No concerns at this time.

## 2021-08-05 ENCOUNTER — Ambulatory Visit: Payer: BC Managed Care – PPO | Admitting: Podiatry

## 2021-09-02 ENCOUNTER — Other Ambulatory Visit: Payer: Self-pay

## 2021-09-02 ENCOUNTER — Ambulatory Visit: Payer: BC Managed Care – PPO | Admitting: Podiatry

## 2021-09-02 ENCOUNTER — Encounter: Payer: Self-pay | Admitting: Podiatry

## 2021-09-02 DIAGNOSIS — B070 Plantar wart: Secondary | ICD-10-CM

## 2021-09-02 MED ORDER — SALICYLIC ACID 10 % EX CREA: 1.0000 "application " | TOPICAL_CREAM | Freq: Every day | CUTANEOUS | 0 refills | Status: DC

## 2021-09-02 NOTE — Progress Notes (Signed)
She presents today states that she was using the Efudex cream and had a slight setback as the skin cracked open and the wart enlarged on the left plantar lateral aspect of the left foot.  Objective: Vital signs are stable she alert oriented x3 the lesion does appear to be much larger than it was previously.  Assessment: Verruca plantaris left.  Failing to improve with Efudex.  Plan: Started her on 10% salicylic acid cream to be covered at nighttime and debrided 2-3 times a week.  Follow-up with her in 2 months if not improved consider laser surgery over the holidays.

## 2021-09-08 ENCOUNTER — Telehealth: Payer: Self-pay | Admitting: Podiatry

## 2021-09-08 NOTE — Telephone Encounter (Signed)
Patient called stating she saw Dr Al Corpus and he prescribed a cream for her. Patient states she has tried multiple pharmacies and no one can get this medication. Patient was wondering if you could prescribe something else that will work. Pt's tele number is [phone].

## 2021-09-08 NOTE — Telephone Encounter (Signed)
This is the salicylic acid 10%

## 2021-09-09 NOTE — Telephone Encounter (Signed)
Patient called. I let pt know that Morrie Sheldon was ordering something and will call pt when it comes in. Patient stated that was fine she will wait for Morrie Sheldon to call.

## 2021-09-23 NOTE — Telephone Encounter (Signed)
Called patient-let her know the medication I ordered for her was in and she could come purchase and pick up at her convenience.

## 2021-10-16 ENCOUNTER — Telehealth: Payer: Self-pay | Admitting: Family Medicine

## 2021-10-16 DIAGNOSIS — Z1231 Encounter for screening mammogram for malignant neoplasm of breast: Secondary | ICD-10-CM

## 2021-10-16 NOTE — Telephone Encounter (Signed)
Pt called asking for a referral for a mammogram at [facility]. Please advise.

## 2021-11-04 ENCOUNTER — Other Ambulatory Visit: Payer: Self-pay

## 2021-11-04 ENCOUNTER — Encounter: Payer: Self-pay | Admitting: Podiatry

## 2021-11-04 ENCOUNTER — Ambulatory Visit: Payer: BC Managed Care – PPO | Admitting: Podiatry

## 2021-11-04 DIAGNOSIS — B070 Plantar wart: Secondary | ICD-10-CM

## 2021-11-04 NOTE — Progress Notes (Signed)
She presents today for follow-up of her plantar wart on the plantar lateral and plantar aspect of the fifth metatarsal head left foot.  States that is better the new stuff is really working well.  Objective: Vital signs are stable alert orient x3 there is no erythema edema cellulitis drainage or odor verrucoid [place] is still intact but much decrease in his thickness and size.  Assessment: Well-healing resolving warts plantar aspect left foot.  Plan: Chemical destruction was applied today with Cantharone under occlusion to be washed off thoroughly tomorrow and she will start back using her salicylic acid 17% daily.

## 2021-11-13 ENCOUNTER — Encounter: Payer: BC Managed Care – PPO | Admitting: Family Medicine

## 2021-12-16 ENCOUNTER — Ambulatory Visit: Payer: BC Managed Care – PPO | Admitting: Podiatry

## 2021-12-21 ENCOUNTER — Ambulatory Visit: Payer: BC Managed Care – PPO | Admitting: Family Medicine

## 2022-01-22 ENCOUNTER — Other Ambulatory Visit: Payer: Self-pay | Admitting: Family Medicine

## 2022-01-22 DIAGNOSIS — N644 Mastodynia: Secondary | ICD-10-CM

## 2022-02-05 ENCOUNTER — Inpatient Hospital Stay: Admission: RE | Admit: 2022-02-05 | Payer: BC Managed Care – PPO | Source: Ambulatory Visit

## 2022-02-05 ENCOUNTER — Other Ambulatory Visit: Payer: BC Managed Care – PPO

## 2022-02-19 ENCOUNTER — Ambulatory Visit
Admission: RE | Admit: 2022-02-19 | Discharge: 2022-02-19 | Disposition: A | Discharge status: Home / Self Care | Payer: BC Managed Care – PPO | Source: Ambulatory Visit | Attending: Family Medicine | Admitting: Family Medicine

## 2022-02-19 ENCOUNTER — Other Ambulatory Visit: Payer: Self-pay

## 2022-02-19 DIAGNOSIS — N644 Mastodynia: Secondary | ICD-10-CM | POA: Diagnosis present

## 2022-03-29 ENCOUNTER — Ambulatory Visit: Payer: BC Managed Care – PPO | Admitting: Family Medicine

## 2022-03-30 ENCOUNTER — Ambulatory Visit: Payer: BC Managed Care – PPO | Admitting: Family Medicine

## 2022-04-01 ENCOUNTER — Ambulatory Visit: Payer: BC Managed Care – PPO | Admitting: Podiatry

## 2022-04-07 ENCOUNTER — Encounter: Payer: Self-pay | Admitting: Family Medicine

## 2022-04-07 ENCOUNTER — Ambulatory Visit: Payer: BC Managed Care – PPO | Admitting: Family Medicine

## 2022-04-07 VITALS — BP 112/62 | HR 94 | Temp 98.0°F | Ht 64.0 in | Wt 237.5 lb

## 2022-04-07 DIAGNOSIS — R2232 Localized swelling, mass and lump, left upper limb: Secondary | ICD-10-CM

## 2022-04-07 DIAGNOSIS — K219 Gastro-esophageal reflux disease without esophagitis: Secondary | ICD-10-CM | POA: Diagnosis not present

## 2022-04-07 DIAGNOSIS — R300 Dysuria: Secondary | ICD-10-CM

## 2022-04-07 DIAGNOSIS — R2230 Localized swelling, mass and lump, unspecified upper limb: Secondary | ICD-10-CM | POA: Insufficient documentation

## 2022-04-07 DIAGNOSIS — R109 Unspecified abdominal pain: Secondary | ICD-10-CM

## 2022-04-07 DIAGNOSIS — G5712 Meralgia paresthetica, left lower limb: Secondary | ICD-10-CM

## 2022-04-07 LAB — POC URINALSYSI DIPSTICK (AUTOMATED)
Bilirubin, UA: NEGATIVE
Blood, UA: NEGATIVE
Glucose, UA: NEGATIVE
Ketones, UA: NEGATIVE
Nitrite, UA: NEGATIVE
Protein, UA: NEGATIVE
Spec Grav, UA: <=1.005 — AB (ref 1.010–1.025)
Urobilinogen, UA: 0.2 E.U./dL
pH, UA: 6 (ref 5.0–8.0)

## 2022-04-07 MED ORDER — CEPHALEXIN 500 MG PO CAPS: 500.0000 mg | ORAL_CAPSULE | Freq: Two times a day (BID) | ORAL | 0 refills | Status: DC

## 2022-04-07 MED ORDER — OMEPRAZOLE 20 MG PO CPDR: 20.0000 mg | DELAYED_RELEASE_CAPSULE | Freq: Every day | ORAL | 3 refills | Status: DC | PRN

## 2022-04-07 NOTE — Assessment & Plan Note (Signed)
Mild/intermittent  ?Some wbc on ua  ?Sent for cx  ?tx with keflex pending result  ? ?Also R flank pain  ?

## 2022-04-07 NOTE — Patient Instructions (Addendum)
For flank pain and dysuria take the keflex to cover uti  ?We will let you know when culture returns  ? ?Drink lots of water  ?Try some heat to the painful area  ? ?I will work on a hand specialist referral  ? ?If symptoms change or get worse let us know  ? ? ?

## 2022-04-07 NOTE — Assessment & Plan Note (Signed)
Fleshy and tender  ?Palmar surface of L 3rd finger ?There for years but recently inflamed /infected at times ?Would like referral to hand specialist  ? ?Referral done  ?inst not to pick at [place] it  ? ?

## 2022-04-07 NOTE — Assessment & Plan Note (Signed)
Pt takes 300 mg gabapentin 4 times daily  ?Symptoms are severe if she does not take it  ?Causes numerous side effects (thinks it makes her side hurt also) ? ?Goes to pain management  ?Has failed Nucynta, belbuca, tramadol, and cymbalta ?Unsure if she tried lyrica  ?

## 2022-04-07 NOTE — Assessment & Plan Note (Signed)
Pain and tenderness  ?Worried about her kidney health  ?Gabapentin makes it worse  ?Intermittent dysuria  ?ua with wbc-sent for culture ?Keflex px for poss uti  ?If neg or no imp consider imaging  ?

## 2022-04-07 NOTE — Assessment & Plan Note (Signed)
Omeprazole helps greatly 20 mg daily  ?refilled ?Enc to watch diet  ? ?

## 2022-04-07 NOTE — Progress Notes (Signed)
? ?Subjective:  ? ? Patient ID: Briana Burnett, female    DOB: 13-Dec-1965, 56 y.o.   MRN: [id number] ? ?HPI ?Pt presents with back pain  ?Also GERD/needs px for omeprazole  ? ?Cyst on L middle finger  ?Swells and hurts  ?Old injury/laceration  ? ? ?Wt Readings from Last 3 Encounters:  ?04/07/22 237 lb 8 oz (107.7 kg)  ?09/15/20 234 lb (106.1 kg)  ?02/19/20 235 lb 6 oz (106.8 kg)  ? ?40.77 kg/m? ? ?Hurting in R low back for about a month  ?Some dysuria  ? ? ?Gabapentin 300 mg four times daily  ?Thinks it made her pain worse  ?Uses it for meralgia paresthetica (pain clinic) -this was caused by injury after strapped to table for surgery in the past  ? ? ?Past h/o chronic pain meds ?.) Medications: ?? Discontinued Nucynta ER 100 mg BID due to heart palpitations ?? Discontinued Belbuca 150 mcg BID due to elevated heart rate, chest pressure and blisters in her mouth ?? Discontinued tramadol 50 mg TID prn-ineffective pain relief ?? Discontinued Cymbalta 30 mg daily-caused significant sedation ?? Continue gabapentin 300 mg QID-refills provided today ? ?Also makes her itch and feel depressed at times  ?It upsets her stomach  ? ?Has h/o sciatica  ?DDD lumbar  ? ? ?Needs to follow up for PE as well  ? ?Results for orders placed or performed in visit on 04/07/22  ?POCT Urinalysis Dipstick (Automated)  ?Result Value Ref Range  ? Color, UA Light Yellow   ? Clarity, UA Clear   ? Glucose, UA Negative Negative  ? Bilirubin, UA Negative   ? Ketones, UA Negative   ? Spec Grav, UA <=1.005 (A) 1.010 - 1.025  ? Blood, UA Negatigve   ? pH, UA 6.0 5.0 - 8.0  ? Protein, UA Negative Negative  ? Urobilinogen, UA 0.2 0.2 or 1.0 E.U./dL  ? Nitrite, UA Negative   ? Leukocytes, UA Small (1+) (A) Negative  ?  ?Cyst on finger  ?L 3rd finger  ?Once was infected  ?Goes up and down  ?Really bothers her   ? ?Patient Active Problem List  ? Diagnosis Date Noted  ? GERD (gastroesophageal reflux disease) 04/07/2022  ? Right flank pain 04/07/2022  ?  Dysuria 04/07/2022  ? Lump on finger 04/07/2022  ? Elevated MCV 02/19/2020  ? Mild hyperlipidemia 02/19/2020  ? Elevated glucose level 02/11/2020  ? Encounter for screening mammogram for breast cancer 01/22/2019  ? Chronic right shoulder pain 01/22/2019  ? Lateral femoral cutaneous neuropathy, 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  ? Preoperative examination 03/27/2018  ? 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  ? Mood change 05/21/2015  ? Morbid obesity (HCC) 05/21/2015  ? Nocturia 07/24/2013  ? Allergic rhinitis 03/13/2012  ? Routine general medical examination at a health care facility 11/25/2011  ? New daily persistent headache 12/18/2009  ? FATIGUE 02/04/2009  ? PALPITATIONS 08/16/2007  ? ?Past Medical History:  ?Diagnosis Date  ? Allergy   ? Anemia   ? h/o  ? Complication of anesthesia   ? Endometriosis   ? GERD (gastroesophageal reflux disease)   ? Headache   ? Heart palpitations   ? has seen dr Mariah Milling  ? Low blood pressure, not hypotension   ? PONV (postoperative nausea and vomiting)   ? nasuea  ? Stress incontinence   ? ?Past Surgical History:  ?Procedure Laterality Date  ? ABDOMINAL HYSTERECTOMY    ? BREAST EXCISIONAL BIOPSY Right   ? neg   ? MRI  05/12/2000  ? brain was neg  ? PLANTAR FASCIA SURGERY Left   ? SHOULDER ARTHROSCOPY WITH ROTATOR CUFF REPAIR Right 04/03/2018  ? Procedure: Right SHOULDER ARTHROSCOPY WITH ROTATOR CUFF REPAIR, distal clavicle excision, decompression and biceps tenotomy;  Surgeon: Lyndle HerrlichBowers, James R, MD;  Location: [facility];  Service: Orthopedics;  Laterality: Right;  ? TONSILLECTOMY    ? age 56  ? ?Social History  ? ?Tobacco Use  ? Smoking status: Never  ? Smokeless tobacco: Never  ?Vaping Use  ? Vaping Use: Never used  ?Substance Use Topics  ? Alcohol use: No  ?  Alcohol/week: 0.0 standard drinks  ? Drug use: No  ? ?Family History  ?Problem Relation Age of Onset  ? COPD Mother   ? Pulmonary embolism Mother   ? Irritable bowel syndrome Mother   ? Cancer Sister [id number]  ?     breast ca  ? Breast cancer Sister [id number]  ? Heart  disease Father   ? Mental illness Father [id number]  ?     dementia  ? COPD Father   ? Alcohol abuse Father   ? Cancer Maternal Aunt   ?     breast ca  ? Breast cancer Maternal Aunt 45  ? Heart disease Maternal Uncle   ? Crohn's disease Maternal Uncle   ? Heart disease Maternal Grandmother   ? Cancer Maternal Aunt   ?     breast ca  ? Breast cancer Maternal Aunt 60  ? ?Allergies  ?Allergen Reactions  ? Nsaids   ?  Other reaction(s): Other ?Blisters in mouth, blurred vision  ? Celebrex [Celecoxib] Other (See Comments)  ?  Leg pain and oral blisters   ? Cortisone Other (See Comments)  ?  Cortisone shot caused uvula swelling, blisters in throat  ? Ibuprofen Other (See Comments)  ?  Several doses causes blurred vision and light headed. PT states it only affects here if she takes several in a short amount of time.   ? Meloxicam   ? ?Current Outpatient Medications on File Prior to Visit  ?Medication Sig Dispense Refill  ? fluorouracil (EFUDEX) 5 % cream Apply topically 2 (two) times daily. 40 g 1  ? gabapentin (NEURONTIN) 300 MG capsule Take 1 capsule by mouth in the morning, at noon, in the evening, and at bedtime.    ? Salicylic Acid 10 % CREA Apply 1 application topically at bedtime. Avoid getting cream on unaffected skin, can cover with bandaid. Clear dead skin away weekly. 227 g 0  ? omeprazole (PRILOSEC) 20 MG capsule Take 1 capsule (20 mg total) by mouth daily. 30 capsule 11  ? ?No current facility-administered medications on file prior to visit.  ?  ?Review of Systems  ?Constitutional:  Positive for fatigue. Negative for activity change, appetite change, fever and unexpected weight change.  ?HENT:  Negative for congestion, ear pain, rhinorrhea, sinus pressure and sore throat.   ?Eyes:  Negative for pain, redness and visual disturbance.  ?Respiratory:  Negative for cough, shortness of breath and wheezing.   ?Cardiovascular:  Negative for chest pain and palpitations.  ?Gastrointestinal:  Negative for abdominal pain, blood  in stool, constipation and diarrhea.  ?Endocrine: Negative for polydipsia and polyuria.  ?Genitourinary:  Positive for dysuria and frequency. Negative for urgency.  ?Musculoskeletal:  Positive for arthralgias and myalgias. Negative for back pain.  ?Skin:  Negative for pallor and rash.  ?Allergic/Immunologic: Negative for environmental allergies.  ?Neurological:  Negative for dizziness, syncope and headaches.  ?  Chronic L leg pain Nita Sickle paresthitica   ?Hematological:  Negative for adenopathy. Does not bruise/bleed easily.  ?Psychiatric/Behavioral:  Negative for decreased concentration and dysphoric mood. The patient is not nervous/anxious.   ? ?   ?Objective:  ? Physical Exam ?Constitutional:   ?   General: She is not in acute distress. ?   Appearance: Normal appearance. She is well-developed. She is obese. She is not ill-appearing or diaphoretic.  ?HENT:  ?   Head: Normocephalic and atraumatic.  ?Eyes:  ?   Conjunctiva/sclera: Conjunctivae normal.  ?   Pupils: Pupils are equal, round, and reactive to light.  ?Neck:  ?   Thyroid: No thyromegaly.  ?   Vascular: No carotid bruit or JVD.  ?Cardiovascular:  ?   Rate and Rhythm: Regular rhythm. Tachycardia present.  ?   Heart sounds: Normal heart sounds.  ?  No gallop.  ?Pulmonary:  ?   Effort: Pulmonary effort is normal. No respiratory distress.  ?   Breath sounds: Normal breath sounds. No stridor. No wheezing, rhonchi or rales.  ?Chest:  ?   Chest wall: No tenderness.  ?Abdominal:  ?   General: There is no distension or abdominal bruit.  ?   Palpations: Abdomen is soft. There is no mass.  ?   Tenderness: There is no abdominal tenderness. There is right CVA tenderness. There is no guarding or rebound.  ?   Hernia: No hernia is present.  ?Musculoskeletal:  ?   Cervical back: Normal range of motion and neck supple.  ?   Right lower leg: No edema.  ?   Left lower leg: No edema.  ?   Comments: Mild LS tenderness  ?  ?Lymphadenopathy:  ?   Cervical: No cervical  adenopathy.  ?Skin: ?   General: Skin is warm and dry.  ?   Coloration: Skin is not jaundiced or pale.  ?   Findings: No bruising, erythema, lesion or rash.  ?   Comments: Lump over palmar surface of L mid 3rd finger

## 2022-04-09 LAB — URINE CULTURE
MICRO NUMBER:: 13346112
SPECIMEN QUALITY:: ADEQUATE

## 2022-04-23 ENCOUNTER — Ambulatory Visit (INDEPENDENT_AMBULATORY_CARE_PROVIDER_SITE_OTHER): Payer: BC Managed Care – PPO | Admitting: Family Medicine

## 2022-04-23 ENCOUNTER — Encounter: Payer: Self-pay | Admitting: Family Medicine

## 2022-04-23 VITALS — BP 130/76 | HR 60 | Temp 97.3°F | Ht 63.5 in | Wt 237.4 lb

## 2022-04-23 DIAGNOSIS — K219 Gastro-esophageal reflux disease without esophagitis: Secondary | ICD-10-CM

## 2022-04-23 DIAGNOSIS — R7309 Other abnormal glucose: Secondary | ICD-10-CM | POA: Diagnosis not present

## 2022-04-23 DIAGNOSIS — Z0000 Encounter for general adult medical examination without abnormal findings: Secondary | ICD-10-CM | POA: Diagnosis not present

## 2022-04-23 DIAGNOSIS — R718 Other abnormality of red blood cells: Secondary | ICD-10-CM

## 2022-04-23 DIAGNOSIS — E785 Hyperlipidemia, unspecified: Secondary | ICD-10-CM | POA: Diagnosis not present

## 2022-04-23 DIAGNOSIS — R300 Dysuria: Secondary | ICD-10-CM

## 2022-04-23 DIAGNOSIS — Z79899 Other long term (current) drug therapy: Secondary | ICD-10-CM | POA: Insufficient documentation

## 2022-04-23 LAB — POC URINALSYSI DIPSTICK (AUTOMATED)
Bilirubin, UA: NEGATIVE
Blood, UA: NEGATIVE
Glucose, UA: NEGATIVE
Ketones, UA: NEGATIVE
Leukocytes, UA: NEGATIVE
Nitrite, UA: NEGATIVE
Protein, UA: NEGATIVE
Spec Grav, UA: 1.025 (ref 1.010–1.025)
Urobilinogen, UA: 0.2 E.U./dL
pH, UA: 6 (ref 5.0–8.0)

## 2022-04-23 NOTE — Progress Notes (Signed)
Subjective:    Patient ID: Briana Burnett, female    DOB: 28-Nov-1966, 56 y.o.   MRN: [id number]  HPI Here for health maintenance exam and to review chronic medical problems     Wt Readings from Last 3 Encounters:  04/23/22 237 lb 6 oz (107.7 kg)  04/07/22 237 lb 8 oz (107.7 kg)  09/15/20 234 lb (106.1 kg)   41.39 kg/m  Immunization History  Administered Date(s) Administered   Influenza Whole 12/18/2009   Influenza,inj,Quad PF,6+ Mos 01/20/2016, 01/22/2019   Tdap 03/13/2012    Zoster status -not immunized   Not covid immunized   Mammogram 02/2022 Self breast exam : no lumps   Colonoscopy 10/2017 - 10 y recall   No pap due to hysterectomy (was told by gyn she did not need pap- but ? 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   (left leg hurts now)  Also chronic shoulder pain and DDD of LS Takes gabapentin  This makes her nauseated first thing in the am   Has to return back to ortho for her shoulder   Last visit tx with keflex for dysuria and flank pain  Culture grew out strep  Still a little burning / improved but not gone   BP Readings from Last 3 Encounters:  04/23/22 130/76  04/07/22 112/62  07/23/21 112/70   Pulse Readings from Last 3 Encounters:  04/23/22 60  04/07/22 94  07/23/21 77     Elevated glucose level in the past   Lab Results  Component Value Date   HGBA1C 5.4 02/12/2020   Due for labs   Hyperlipidemia Lab Results  Component Value Date   CHOL 171 02/12/2020   HDL 40.30 02/12/2020   LDLCALC 117 (H) 02/12/2020   TRIG 65.0 02/12/2020   CHOLHDL 4 02/12/2020    Not a lot of exercise due to severe chronic pain  (walked prior to that)   Diet :  Healthy for the most part  Does not fry food anymore   Not a lot of processed foods   Likes gummy candy and candy apples   Patient Active  Problem List   Diagnosis Date Noted   Current use of proton pump inhibitor 04/23/2022   GERD (gastroesophageal reflux disease) 04/07/2022   Right flank pain 04/07/2022   Dysuria 04/07/2022   Lump on finger 04/07/2022   Elevated MCV 02/19/2020   Mild hyperlipidemia 02/19/2020   Elevated glucose level 02/11/2020   Encounter for screening mammogram for breast cancer 01/22/2019   Chronic right shoulder pain 01/22/2019   Lateral femoral cutaneous neuropathy, 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   Preoperative examination 03/27/2018   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   Mood change 05/21/2015   Morbid obesity ([place]) 05/21/2015   Nocturia 07/24/2013   Allergic rhinitis 03/13/2012   Routine general medical examination at a health care facility 11/25/2011   New daily persistent headache 12/18/2009   FATIGUE 02/04/2009   PALPITATIONS 08/16/2007   Past Medical History:  Diagnosis Date   Allergy    Anemia    h/o   Complication of anesthesia    Endometriosis    GERD (gastroesophageal reflux disease)    Headache    Heart palpitations    has seen dr Rockey Situ   Low blood pressure, not hypotension    PONV (postoperative nausea and vomiting)  nasuea   Stress incontinence    Past Surgical History:  Procedure Laterality Date   ABDOMINAL HYSTERECTOMY     BREAST EXCISIONAL BIOPSY Right    neg    MRI  05/12/2000   brain was neg   PLANTAR FASCIA SURGERY Left    SHOULDER ARTHROSCOPY WITH ROTATOR CUFF REPAIR Right 04/03/2018   Procedure: Right SHOULDER ARTHROSCOPY WITH ROTATOR CUFF REPAIR, distal clavicle excision, decompression and biceps tenotomy;  Surgeon: Lovell Sheehan, MD;  Location: [facility] OR[facility];  Service: Orthopedics;  Laterality: Right;   TONSILLECTOMY     age 59   Social History   Tobacco Use   Smoking status: Never   Smokeless tobacco: Never  Vaping Use   Vaping Use: Never used  Substance Use Topics   Alcohol use: No    Alcohol/week: 0.0 standard drinks   Drug use: No   Family History  Problem  Relation Age of Onset   COPD Mother    Pulmonary embolism Mother    Irritable bowel syndrome Mother    Cancer Sister 76       breast ca   Breast cancer Sister 70   Heart disease Father    Mental illness Father 90       dementia   COPD Father    Alcohol abuse Father    Cancer Maternal Aunt        breast ca   Breast cancer Maternal Aunt 30   Heart disease Maternal Uncle    Crohn's disease Maternal Uncle    Heart disease Maternal Grandmother    Cancer Maternal Aunt        breast ca   Breast cancer Maternal Aunt 60   Allergies  Allergen Reactions   Nsaids     Other reaction(s): Other Blisters in mouth, blurred vision   Celebrex [Celecoxib] Other (See Comments)    Leg pain and oral blisters    Cortisone Other (See Comments)    Cortisone shot caused uvula swelling, blisters in throat   Ibuprofen Other (See Comments)    Several doses causes blurred vision and light headed. PT states it only affects here if she takes several in a short amount of time.    Meloxicam    Current Outpatient Medications on File Prior to Visit  Medication Sig Dispense Refill   fluorouracil (EFUDEX) 5 % cream Apply topically 2 (two) times daily. 40 g 1   gabapentin (NEURONTIN) 100 MG capsule Take 300 mg by mouth in the morning, at noon, in the evening, and at bedtime.     NUCYNTA 50 MG tablet Take 50 mg by mouth every 6 (six) hours as needed.     omeprazole (PRILOSEC) 20 MG capsule Take 1 capsule (20 mg total) by mouth daily as needed. 90 capsule 3   No current facility-administered medications on file prior to visit.    Review of Systems  Constitutional:  Positive for fatigue. Negative for activity change, appetite change, fever and unexpected weight change.  HENT:  Negative for congestion, ear pain, rhinorrhea, sinus pressure and sore throat.   Eyes:  Negative for pain, redness and visual disturbance.  Respiratory:  Negative for cough, shortness of breath and wheezing.   Cardiovascular:  Negative  for chest pain and palpitations.  Gastrointestinal:  Negative for abdominal pain, blood in stool, constipation and diarrhea.  Endocrine: Negative for polydipsia and polyuria.  Genitourinary:  Negative for dysuria, frequency and urgency.  Musculoskeletal:  Positive for arthralgias and back pain. Negative for myalgias.  Skin:  Negative for pallor and rash.  Allergic/Immunologic: Negative for environmental allergies.  Neurological:  Negative for dizziness, syncope and headaches.  Hematological:  Negative for adenopathy. Does not bruise/bleed easily.  Psychiatric/Behavioral:  Negative for decreased concentration and dysphoric mood. The patient is not nervous/anxious.        Stressors      Objective:   Physical Exam Constitutional:      General: She is not in acute distress.    Appearance: Normal appearance. She is well-developed. She is obese. She is not ill-appearing or diaphoretic.  HENT:     Head: Normocephalic and atraumatic.     Right Ear: Tympanic membrane, ear canal and external ear normal.     Left Ear: Tympanic membrane, ear canal and external ear normal.     Nose: Nose normal. No congestion.     Mouth/Throat:     Mouth: Mucous membranes are moist.     Pharynx: Oropharynx is clear. No posterior oropharyngeal erythema.  Eyes:     General: No scleral icterus.    Extraocular Movements: Extraocular movements intact.     Conjunctiva/sclera: Conjunctivae normal.     Pupils: Pupils are equal, round, and reactive to light.  Neck:     Thyroid: No thyromegaly.     Vascular: No carotid bruit or JVD.  Cardiovascular:     Rate and Rhythm: Normal rate and regular rhythm.     Pulses: Normal pulses.     Heart sounds: Normal heart sounds.    No gallop.  Pulmonary:     Effort: Pulmonary effort is normal. No respiratory distress.     Breath sounds: Normal breath sounds. No wheezing.     Comments: Good air exch Chest:     Chest wall: No tenderness.  Abdominal:     General: Bowel sounds  are normal. There is no distension or abdominal bruit.     Palpations: Abdomen is soft. There is no mass.     Tenderness: There is no abdominal tenderness.     Hernia: No hernia is present.  Genitourinary:    Comments: Breast exam: No mass, nodules, thickening, tenderness, bulging, retraction, inflamation, nipple discharge or skin changes noted.  No axillary or clavicular LA.     Musculoskeletal:        General: No tenderness. Normal range of motion.     Cervical back: Normal range of motion and neck supple. No rigidity. No muscular tenderness.     Right lower leg: No edema.     Left lower leg: No edema.     Comments: No kyphosis   Limited rom of shoulders and hips Pain with movement   Lymphadenopathy:     Cervical: No cervical adenopathy.  Skin:    General: Skin is warm and dry.     Coloration: Skin is not pale.     Findings: No erythema or rash.  Neurological:     Mental Status: She is alert. Mental status is at baseline.     Cranial Nerves: No cranial nerve deficit.     Motor: No abnormal muscle tone.     Coordination: Coordination normal.     Gait: Gait normal.     Deep Tendon Reflexes: Reflexes are normal and symmetric. Reflexes normal.  Psychiatric:        Mood and Affect: Mood normal.        Cognition and Memory: Cognition and memory normal.     Comments: Candidly discusses symptoms and stressors  Assessment & Plan:   Problem List Items Addressed This Visit       Digestive   GERD (gastroesophageal reflux disease)    Omeprazole 20 mg daily controls this          Other   Current use of proton pump inhibitor    B12 level added to labs       Relevant Orders   Vitamin B12 (Completed)   Dysuria    Recently tx uti with keflex  cx grew strep Clinically improved but still has mild dysuria  ua clear  cx pending  Enc strongly to drink more water        Relevant Orders   POCT Urinalysis Dipstick (Automated) (Completed)   Urine Culture  (Completed)   Elevated glucose level    a1c ordered Lab Results  Component Value Date   HGBA1C 5.5 04/23/2022         Relevant Orders   Hemoglobin A1c (Completed)   Elevated MCV    B12 level added to labs       Mild hyperlipidemia    Disc goals for lipids and reasons to control them Rev last labs with pt Rev low sat fat diet in detail  Diet controlled Exercise is limited due to chronic pain       Relevant Orders   Lipid panel (Completed)   Morbid obesity ([place])    Discussed how this problem influences overall health and the risks it imposes  Reviewed plan for weight loss with lower calorie diet (via better food choices and also portion control or program like weight watchers) and exercise building up to or more than 30 minutes 5 days per week including some aerobic activity          Routine general medical examination at a health care facility - Primary    Reviewed health habits including diet and exercise and skin cancer prevention Reviewed appropriate screening tests for age  Also reviewed health mt list, fam hx and immunization status , as well as social and family history   See HPI Labs ordered  Mammogram utd  Colonoscopy utd S/p hysterectomy- enc pt to find out what kind of hysterectomy she had  Enc good health habits/ this is challenging due to chronic pain  Low glycemic diet would be helpful        Relevant Orders   TSH (Completed)   Lipid panel (Completed)   Comprehensive metabolic panel (Completed)   CBC with Differential/Platelet (Completed)

## 2022-04-23 NOTE — Patient Instructions (Addendum)
Give a urine sample on the way out   Please increase your fluids/mostly water to at least 64 oz daily   Take care of yourself   Try and eat less sugar Try to get most of your carbohydrates from produce (with the exception of white potatoes)  Eat less bread/pasta/rice/snack foods/cereals/sweets and other items from the middle of the grocery store (processed carbs)  Be as active as active as you can be safely

## 2022-04-24 LAB — HEMOGLOBIN A1C
Hgb A1c MFr Bld: 5.5 % of total Hgb (ref <5.7)
Mean Plasma Glucose: 111 mg/dL
eAG (mmol/L): 6.2 mmol/L

## 2022-04-24 LAB — VITAMIN B12: Vitamin B-12: 437 pg/mL (ref 200–1100)

## 2022-04-24 LAB — URINE CULTURE
MICRO NUMBER:: 13420534
SPECIMEN QUALITY:: ADEQUATE

## 2022-04-24 LAB — CBC WITH DIFFERENTIAL/PLATELET
Absolute Monocytes: 641 cells/uL (ref 200–950)
Basophils Absolute: 43 cells/uL (ref 0–200)
Basophils Relative: 0.6 %
Eosinophils Absolute: 94 cells/uL (ref 15–500)
Eosinophils Relative: 1.3 %
HCT: 37.5 % (ref 35.0–45.0)
Hemoglobin: 13 g/dL (ref 11.7–15.5)
Lymphs Abs: 1210 cells/uL (ref 850–3900)
MCH: 34.1 pg — ABNORMAL HIGH (ref 27.0–33.0)
MCHC: 34.7 g/dL (ref 32.0–36.0)
MCV: 98.4 fL (ref 80.0–100.0)
MPV: 10.2 fL (ref 7.5–12.5)
Monocytes Relative: 8.9 %
Neutro Abs: 5213 cells/uL (ref 1500–7800)
Neutrophils Relative %: 72.4 %
Platelets: 300 10*3/uL (ref 140–400)
RBC: 3.81 10*6/uL (ref 3.80–5.10)
RDW: 12.6 % (ref 11.0–15.0)
Total Lymphocyte: 16.8 %
WBC: 7.2 10*3/uL (ref 3.8–10.8)

## 2022-04-24 LAB — COMPREHENSIVE METABOLIC PANEL
AG Ratio: 1.5 (calc) (ref 1.0–2.5)
ALT: 19 U/L (ref 6–29)
AST: 15 U/L (ref 10–35)
Albumin: 4 g/dL (ref 3.6–5.1)
Alkaline phosphatase (APISO): 91 U/L (ref 37–153)
BUN: 15 mg/dL (ref 7–25)
CO2: 27 mmol/L (ref 20–32)
Calcium: 8.9 mg/dL (ref 8.6–10.4)
Chloride: 105 mmol/L (ref 98–110)
Creat: 0.66 mg/dL (ref 0.50–1.03)
Globulin: 2.7 g/dL (calc) (ref 1.9–3.7)
Glucose, Bld: 86 mg/dL (ref 65–99)
Potassium: 4.4 mmol/L (ref 3.5–5.3)
Sodium: 142 mmol/L (ref 135–146)
Total Bilirubin: 0.3 mg/dL (ref 0.2–1.2)
Total Protein: 6.7 g/dL (ref 6.1–8.1)

## 2022-04-24 LAB — LIPID PANEL
Cholesterol: 166 mg/dL (ref <200)
HDL: 50 mg/dL (ref >=50)
LDL Cholesterol (Calc): 101 mg/dL (calc) — ABNORMAL HIGH
Non-HDL Cholesterol (Calc): 116 mg/dL (calc) (ref <130)
Total CHOL/HDL Ratio: 3.3 (calc) (ref <5.0)
Triglycerides: 65 mg/dL (ref <150)

## 2022-04-24 LAB — TSH: TSH: 0.69 mIU/L

## 2022-04-25 NOTE — Assessment & Plan Note (Signed)
Omeprazole 20 mg daily controls this

## 2022-04-25 NOTE — Assessment & Plan Note (Signed)
B12 level added to labs  °

## 2022-04-25 NOTE — Assessment & Plan Note (Signed)
Recently tx uti with keflex  cx grew strep Clinically improved but still has mild dysuria  ua clear  cx pending  Enc strongly to drink more water

## 2022-04-25 NOTE — Assessment & Plan Note (Signed)
Reviewed health habits including diet and exercise and skin cancer prevention Reviewed appropriate screening tests for age  Also reviewed health mt list, fam hx and immunization status , as well as social and family history   See HPI Labs ordered  Mammogram utd  Colonoscopy utd S/p hysterectomy- enc pt to find out what kind of hysterectomy she had  Enc good health habits/ this is challenging due to chronic pain  Low glycemic diet would be helpful

## 2022-04-25 NOTE — Assessment & Plan Note (Signed)
Disc goals for lipids and reasons to control them Rev last labs with pt Rev low sat fat diet in detail  Diet controlled Exercise is limited due to chronic pain

## 2022-04-25 NOTE — Assessment & Plan Note (Signed)
a1c ordered Lab Results  Component Value Date   HGBA1C 5.5 04/23/2022

## 2022-04-25 NOTE — Assessment & Plan Note (Signed)
Discussed how this problem influences overall health and the risks it imposes  Reviewed plan for weight loss with lower calorie diet (via better food choices and also portion control or program like weight watchers) and exercise building up to or more than 30 minutes 5 days per week including some aerobic activity    

## 2022-05-10 ENCOUNTER — Encounter: Payer: Self-pay | Admitting: Podiatry

## 2022-05-10 ENCOUNTER — Ambulatory Visit: Payer: BC Managed Care – PPO | Admitting: Podiatry

## 2022-05-10 DIAGNOSIS — B070 Plantar wart: Secondary | ICD-10-CM | POA: Diagnosis not present

## 2022-05-10 NOTE — Progress Notes (Signed)
She presents today for follow-up of her plantar wart times 3 subfifth metatarsal of her left foot.  States that they were doing very well and were going away until I got a big crack in my foot just on this side as she points to the proximal edge of the largest wart.  Says she stopped using the medication for a while to heal the wound and has now started back on the medication.  She states that they seem to be getting better and she is happy with the outcome.  Objective: Vital signs are stable she is alert and oriented x3.  Pulses are palpable.  There is no open lesions or wounds.  She does have multiple small verrucoid lesions along the lateral border of the subfifth metatarsal head area.  Thrombosed capillaries are much more superficial than they were previously and the lesions are much smaller than they were previously.  Assessment: Well-healing verruca plantaris left foot.  Plan: Encouraged her to continue with the 17% salicylic acid once again and to continue with the Efudex cream as well.  Follow-up with me in 6 weeks

## 2022-06-21 ENCOUNTER — Ambulatory Visit: Payer: BC Managed Care – PPO | Admitting: Podiatry

## 2022-12-21 ENCOUNTER — Ambulatory Visit: Payer: BC Managed Care – PPO | Admitting: Primary Care

## 2023-03-09 ENCOUNTER — Other Ambulatory Visit: Payer: Self-pay | Admitting: Family Medicine

## 2023-03-15 ENCOUNTER — Other Ambulatory Visit: Payer: Self-pay | Admitting: Family Medicine

## 2023-05-24 ENCOUNTER — Telehealth: Payer: Self-pay | Admitting: Family Medicine

## 2023-05-24 ENCOUNTER — Encounter: Payer: Self-pay | Admitting: Family Medicine

## 2023-05-24 DIAGNOSIS — Z1231 Encounter for screening mammogram for malignant neoplasm of breast: Secondary | ICD-10-CM

## 2023-05-24 NOTE — Telephone Encounter (Signed)
Patient would like an order sent out to [facility] to have a mammogram done.

## 2023-05-24 NOTE — Telephone Encounter (Signed)
Test ordered She can call to schedule

## 2023-05-24 NOTE — Telephone Encounter (Signed)
Pt.notified

## 2023-06-01 ENCOUNTER — Other Ambulatory Visit: Payer: Self-pay | Admitting: Family Medicine

## 2023-06-01 ENCOUNTER — Ambulatory Visit: Payer: BC Managed Care – PPO | Admitting: Family Medicine

## 2023-06-01 ENCOUNTER — Encounter: Payer: Self-pay | Admitting: Family Medicine

## 2023-06-01 VITALS — BP 110/62 | HR 62 | Temp 97.5°F | Ht 63.5 in | Wt 231.5 lb

## 2023-06-01 DIAGNOSIS — R1013 Epigastric pain: Secondary | ICD-10-CM | POA: Diagnosis not present

## 2023-06-01 MED ORDER — SUCRALFATE 1 G PO TABS: 1.0000 g | ORAL_TABLET | Freq: Three times a day (TID) | ORAL | 0 refills | Status: DC

## 2023-06-01 MED ORDER — ONDANSETRON HCL 4 MG PO TABS: 4.0000 mg | ORAL_TABLET | Freq: Three times a day (TID) | ORAL | 1 refills | Status: DC | PRN

## 2023-06-01 NOTE — Assessment & Plan Note (Signed)
This is acute on chornic -worse since Saturday/ continuous and worse on the left side than previous  Sharp to burning and some tenderness Worse with food  Reviewed previous work up from 2018 incl [place], EGD and colonoscopy (had mild gastritis on egd) - bx were reassuring  GI is Dr Adela Lank  Past history of total hysterectomy Not taking nsaids or drinking etoh  Labs today  Encouraged to continue omeprazole 20 mg daily  Prescription carafate and zofran given for symptoms Discussed ER precautions in detail  Consider CT if labs are unrevealing   Meds ordered this encounter  Medications   sucralfate (CARAFATE) 1 g tablet    Sig: Take 1 tablet (1 g total) by mouth 4 (four) times daily -  with meals and at bedtime.    Dispense:  60 tablet    Refill:  0   ondansetron (ZOFRAN) 4 MG tablet    Sig: Take 1 tablet (4 mg total) by mouth every 8 (eight) hours as needed for nausea or vomiting. Caution of sedation    Dispense:  20 tablet    Refill:  1

## 2023-06-01 NOTE — Patient Instructions (Signed)
Avoid foods that flare symptoms  Watch for fever or vomiting   Labs are pending    Continue the omeprazole Try the carafate as directed  Zofran for nausea as directed    If severe symptoms go to the ER   I may want to order a CT scan after labs

## 2023-06-01 NOTE — Progress Notes (Signed)
Subjective:    Patient ID: Briana Burnett, female    DOB: [birth year], 57 y.o.   MRN: [id number]  HPI  Wt Readings from Last 3 Encounters:  06/01/23 231 lb 8 oz (105 kg)  04/23/22 237 lb 6 oz (107.7 kg)  04/07/22 237 lb 8 oz (107.7 kg)   40.37 kg/m  Vitals:   06/01/23 1602  BP: 110/62  Pulse: 62  Temp: (!) 97.5 F (36.4 C)  SpO2: 97%    Pt presents with GI symptoms  Pain in epigastrium  Cannot lay flat on back   Past 2 months pain is worse Then radiating to the left  Aching A little bit burning  Tender to the touch under left ribs   6/10 on pain scale  Eating makes it worse Has not eaten much since sat  No fever    Since sat non stop  Moreso on the left   Nauseated Clammy at times  Worse now than it was then     Radiates to back a bit   Some loose stool    (has urgency of stool after eating)  No constipation unless she eats a lot of cheese  No blood  No black stool   Takes omeprazole 20 mg daily for GERD   Colonoscopy 2018-normal   EGD 2018 -normal (slight chronic gastritis)   Normal [place] of abdomen in 08/2017 -no gallstones   Dr Adela Lank is GI  Tried ppi and carafate and zofran - unsure if they helps   No over the counter meds   Takes gabapentin long term  Had a cortisone shot in shoulder area recently = ? 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   Epigastric pain 08/22/2017   Mood  change 05/21/2015   Morbid obesity (HCC) 05/21/2015   Nocturia 07/24/2013   Allergic rhinitis 03/13/2012   Routine general medical examination at a health care facility 11/25/2011   New daily persistent headache 12/18/2009   FATIGUE 02/04/2009   PALPITATIONS 08/16/2007   Past Medical History:  Diagnosis Date   Allergy    Anemia    h/o   Complication of anesthesia    Endometriosis    GERD (gastroesophageal reflux disease)    Headache    Heart palpitations    has seen dr Mariah Milling   Low blood pressure, not hypotension    PONV (postoperative nausea and vomiting)    nasuea   Stress incontinence    Past Surgical History:  Procedure Laterality Date   ABDOMINAL HYSTERECTOMY     BREAST EXCISIONAL BIOPSY Right    neg    MRI  05/12/2000   brain was neg   PLANTAR FASCIA SURGERY Left    SHOULDER ARTHROSCOPY WITH ROTATOR CUFF REPAIR Right 04/03/2018   Procedure: Right SHOULDER ARTHROSCOPY WITH ROTATOR CUFF REPAIR, distal clavicle excision, decompression and biceps tenotomy;  Surgeon: Lyndle Herrlich, MD;  Location: [facility];  Service: Orthopedics;  Laterality: Right;  TONSILLECTOMY     age 34   Social History   Tobacco Use   Smoking status: Never   Smokeless tobacco: Never  Vaping Use   Vaping Use: Never used  Substance Use Topics   Alcohol use: No    Alcohol/week: 0.0 standard drinks of alcohol   Drug use: No   Family History  Problem Relation Age of Onset   COPD Mother    Pulmonary embolism Mother    Irritable bowel syndrome Mother    Cancer Sister 24       breast ca   Breast cancer Sister 54   Heart disease Father    Mental illness Father 27       dementia   COPD Father    Alcohol abuse Father    Cancer Maternal Aunt        breast ca   Breast cancer Maternal Aunt 45   Heart disease Maternal Uncle    Crohn's disease Maternal Uncle    Heart disease Maternal Grandmother    Cancer Maternal Aunt        breast ca   Breast cancer Maternal Aunt 60   Allergies   Allergen Reactions   Nsaids     Other reaction(s): Other Blisters in mouth, blurred vision   Celebrex [Celecoxib] Other (See Comments)    Leg pain and oral blisters    Cortisone Other (See Comments)    Cortisone shot caused uvula swelling, blisters in throat   Ibuprofen Other (See Comments)    Several doses causes blurred vision and light headed. PT states it only affects here if she takes several in a short amount of time.    Meloxicam    Current Outpatient Medications on File Prior to Visit  Medication Sig Dispense Refill   gabapentin (NEURONTIN) 100 MG capsule Take 300 mg by mouth in the morning, at noon, in the evening, and at bedtime.     omeprazole (PRILOSEC) 20 MG capsule TAKE 1 CAPSULE BY MOUTH DAILY AS NEEDED. 90 capsule 0   No current facility-administered medications on file prior to visit.    Review of Systems  Constitutional:  Negative for activity change, appetite change, fatigue, fever and unexpected weight change.  HENT:  Negative for congestion, ear pain, rhinorrhea, sinus pressure and sore throat.   Eyes:  Negative for pain, redness and visual disturbance.  Respiratory:  Negative for cough, shortness of breath and wheezing.   Cardiovascular:  Negative for chest pain and palpitations.  Gastrointestinal:  Positive for abdominal pain and nausea. Negative for abdominal distention, anal bleeding, blood in stool, constipation, rectal pain and vomiting.       Stool is loose and sometimes urgent after eating   Endocrine: Negative for polydipsia and polyuria.  Genitourinary:  Negative for dysuria, frequency and urgency.  Musculoskeletal:  Negative for arthralgias, back pain and myalgias.  Skin:  Negative for pallor and rash.  Allergic/Immunologic: Negative for environmental allergies.  Neurological:  Negative for dizziness, syncope and headaches.  Hematological:  Negative for adenopathy. Does not bruise/bleed easily.  Psychiatric/Behavioral:  Negative for decreased  concentration and dysphoric mood. The patient is not nervous/anxious.        Objective:   Physical Exam Constitutional:      General: She is not in acute distress.    Appearance: Normal appearance. She is well-developed. She is obese. She is not ill-appearing or diaphoretic.  HENT:     Head: Normocephalic and atraumatic.     Mouth/Throat:  Mouth: Mucous membranes are moist.     Pharynx: Oropharynx is clear.  Eyes:     General: No scleral icterus.    Conjunctiva/sclera: Conjunctivae normal.     Pupils: Pupils are equal, round, and reactive to light.  Cardiovascular:     Rate and Rhythm: Normal rate and regular rhythm.     Heart sounds: Normal heart sounds.  Pulmonary:     Effort: Pulmonary effort is normal. No respiratory distress.     Breath sounds: Normal breath sounds. No wheezing or rales.  Abdominal:     General: Abdomen is protuberant. Bowel sounds are normal. There is no distension.     Palpations: Abdomen is soft. There is no shifting dullness, fluid wave, hepatomegaly, splenomegaly, mass or pulsatile mass.     Tenderness: There is abdominal tenderness in the epigastric area and left upper quadrant. There is no right CVA tenderness, left CVA tenderness, guarding or rebound. Negative signs include Murphy's sign and McBurney's sign.     Hernia: No hernia is present.     Comments: Very slight RUQ tenderness with deep palpation  Mostly tender mid to left   No obvious hernia   Musculoskeletal:     Cervical back: Normal range of motion and neck supple.  Lymphadenopathy:     Cervical: No cervical adenopathy.  Skin:    General: Skin is warm and dry.     Coloration: Skin is not pale.     Findings: No erythema.  Neurological:     Mental Status: She is alert.     Motor: No weakness.  Psychiatric:        Mood and Affect: Mood normal.           Assessment & Plan:   Problem List Items Addressed This Visit       Other   Epigastric pain - Primary    This is acute  on chornic -worse since Saturday/ continuous and worse on the left side than previous  Sharp to burning and some tenderness Worse with food  Reviewed previous work up from 2018 incl [place], EGD and colonoscopy (had mild gastritis on egd) - bx were reassuring  GI is Dr Adela Lank  Past history of total hysterectomy Not taking nsaids or drinking etoh  Labs today  Encouraged to continue omeprazole 20 mg daily  Prescription carafate and zofran given for symptoms Discussed ER precautions in detail  Consider CT if labs are unrevealing   Meds ordered this encounter  Medications   sucralfate (CARAFATE) 1 g tablet    Sig: Take 1 tablet (1 g total) by mouth 4 (four) times daily -  with meals and at bedtime.    Dispense:  60 tablet    Refill:  0   ondansetron (ZOFRAN) 4 MG tablet    Sig: Take 1 tablet (4 mg total) by mouth every 8 (eight) hours as needed for nausea or vomiting. Caution of sedation    Dispense:  20 tablet    Refill:  1        Relevant Orders   Basic metabolic panel   CBC with Differential/Platelet   Hepatic function panel   Lipase

## 2023-06-02 ENCOUNTER — Telehealth: Payer: Self-pay | Admitting: Family Medicine

## 2023-06-02 DIAGNOSIS — R1013 Epigastric pain: Secondary | ICD-10-CM

## 2023-06-02 LAB — CBC WITH DIFFERENTIAL/PLATELET
Basophils Absolute: 0.1 10*3/uL (ref 0.0–0.1)
Basophils Relative: 0.6 % (ref 0.0–3.0)
Eosinophils Absolute: 0.1 10*3/uL (ref 0.0–0.7)
Eosinophils Relative: 0.8 % (ref 0.0–5.0)
HCT: 39.1 % (ref 36.0–46.0)
Hemoglobin: 12.9 g/dL (ref 12.0–15.0)
Lymphocytes Relative: 13.7 % (ref 12.0–46.0)
Lymphs Abs: 1.3 10*3/uL (ref 0.7–4.0)
MCHC: 32.9 g/dL (ref 30.0–36.0)
MCV: 101.3 fl — ABNORMAL HIGH (ref 78.0–100.0)
Monocytes Absolute: 0.2 10*3/uL (ref 0.1–1.0)
Monocytes Relative: 1.8 % — ABNORMAL LOW (ref 3.0–12.0)
Neutro Abs: 7.6 10*3/uL (ref 1.4–7.7)
Neutrophils Relative %: 83.1 % — ABNORMAL HIGH (ref 43.0–77.0)
Platelets: 362 10*3/uL (ref 150.0–400.0)
RBC: 3.86 Mil/uL — ABNORMAL LOW (ref 3.87–5.11)
RDW: 13.6 % (ref 11.5–15.5)
WBC: 9.1 10*3/uL (ref 4.0–10.5)

## 2023-06-02 LAB — HEPATIC FUNCTION PANEL
ALT: 21 U/L (ref 0–35)
AST: 15 U/L (ref 0–37)
Albumin: 3.9 g/dL (ref 3.5–5.2)
Alkaline Phosphatase: 83 U/L (ref 39–117)
Bilirubin, Direct: 0.1 mg/dL (ref 0.0–0.3)
Total Bilirubin: 0.4 mg/dL (ref 0.2–1.2)
Total Protein: 6.8 g/dL (ref 6.0–8.3)

## 2023-06-02 LAB — BASIC METABOLIC PANEL
BUN: 15 mg/dL (ref 6–23)
CO2: 34 mEq/L — ABNORMAL HIGH (ref 19–32)
Calcium: 9.6 mg/dL (ref 8.4–10.5)
Chloride: 102 mEq/L (ref 96–112)
Creatinine, Ser: 0.81 mg/dL (ref 0.40–1.20)
GFR: 80.89 mL/min (ref >60.00)
Glucose, Bld: 106 mg/dL — ABNORMAL HIGH (ref 70–99)
Potassium: 4.4 mEq/L (ref 3.5–5.1)
Sodium: 142 mEq/L (ref 135–145)

## 2023-06-02 LAB — LIPASE: Lipase: 37 U/L (ref 11.0–59.0)

## 2023-06-02 NOTE — Telephone Encounter (Signed)
Can't comment until PCP reviews them.

## 2023-06-02 NOTE — Telephone Encounter (Signed)
Patient is requesting a phone call to discuss lab results She stated that she seen them through [place] and is really concerned

## 2023-06-02 NOTE — Telephone Encounter (Signed)
Pt was seen yesterday for GI problems, okay to refill?

## 2023-06-03 ENCOUNTER — Encounter: Payer: Self-pay | Admitting: Family Medicine

## 2023-06-03 ENCOUNTER — Encounter: Payer: Self-pay | Admitting: *Deleted

## 2023-06-03 NOTE — Telephone Encounter (Signed)
I put the order in  Let me know if you don't hear from someone in a week or so   I am currently out of town but will be on computer periodically

## 2023-06-03 NOTE — Telephone Encounter (Signed)
Called patient reviewed all information and repeated back to me. Will call if any questions.  She would like CT in [place]  carafate or zofran have not been picked up yet. She will get today and start. She will send my chart and let you know if helping after taking.

## 2023-06-03 NOTE — Telephone Encounter (Signed)
I commented on them (looked ok) See that  Want to get a CT if possible What area does she prefer? Is the carafate or zofran helping at all?

## 2023-06-03 NOTE — Addendum Note (Signed)
Addended by: Roxy Manns A on: 06/03/2023 02:16 PM   Modules accepted: Orders

## 2023-06-07 ENCOUNTER — Other Ambulatory Visit: Payer: BC Managed Care – PPO

## 2023-06-07 ENCOUNTER — Ambulatory Visit
Admission: RE | Admit: 2023-06-07 | Discharge: 2023-06-07 | Disposition: A | Discharge status: Home / Self Care | Payer: BC Managed Care – PPO | Source: Ambulatory Visit | Attending: Family Medicine | Admitting: Family Medicine

## 2023-06-07 DIAGNOSIS — R1013 Epigastric pain: Secondary | ICD-10-CM

## 2023-06-07 MED ORDER — IOPAMIDOL (ISOVUE-300) INJECTION 61%
100.0000 mL | Freq: Once | INTRAVENOUS | Status: AC | PRN
  Administered 2023-06-07: 100 mL via INTRAVENOUS

## 2023-06-16 ENCOUNTER — Telehealth: Payer: Self-pay | Admitting: Family Medicine

## 2023-06-16 DIAGNOSIS — Z79899 Other long term (current) drug therapy: Secondary | ICD-10-CM

## 2023-06-16 DIAGNOSIS — R7309 Other abnormal glucose: Secondary | ICD-10-CM

## 2023-06-16 DIAGNOSIS — E785 Hyperlipidemia, unspecified: Secondary | ICD-10-CM

## 2023-06-16 DIAGNOSIS — Z0000 Encounter for general adult medical examination without abnormal findings: Secondary | ICD-10-CM

## 2023-06-16 NOTE — Telephone Encounter (Signed)
-----   Message from Alvina Chou sent at 06/03/2023 12:44 PM EDT ----- Regarding: Lab orders for Friday, 7.12.24 Patient is scheduled for CPX labs, please order future labs, Thanks , Camelia Eng

## 2023-06-17 ENCOUNTER — Other Ambulatory Visit (INDEPENDENT_AMBULATORY_CARE_PROVIDER_SITE_OTHER): Payer: BC Managed Care – PPO

## 2023-06-17 DIAGNOSIS — R7309 Other abnormal glucose: Secondary | ICD-10-CM | POA: Diagnosis not present

## 2023-06-17 DIAGNOSIS — E785 Hyperlipidemia, unspecified: Secondary | ICD-10-CM | POA: Diagnosis not present

## 2023-06-17 DIAGNOSIS — Z79899 Other long term (current) drug therapy: Secondary | ICD-10-CM | POA: Diagnosis not present

## 2023-06-17 DIAGNOSIS — Z0000 Encounter for general adult medical examination without abnormal findings: Secondary | ICD-10-CM

## 2023-06-17 LAB — LIPID PANEL
Cholesterol: 159 mg/dL (ref 0–200)
HDL: 44.6 mg/dL (ref >39.00)
LDL Cholesterol: 100 mg/dL — ABNORMAL HIGH (ref 0–99)
NonHDL: 114.39
Total CHOL/HDL Ratio: 4
Triglycerides: 73 mg/dL (ref 0.0–149.0)
VLDL: 14.6 mg/dL (ref 0.0–40.0)

## 2023-06-17 LAB — VITAMIN B12: Vitamin B-12: 281 pg/mL (ref 211–911)

## 2023-06-17 LAB — CBC WITH DIFFERENTIAL/PLATELET
Basophils Absolute: 0 10*3/uL (ref 0.0–0.1)
Basophils Relative: 0.5 % (ref 0.0–3.0)
Eosinophils Absolute: 0.1 10*3/uL (ref 0.0–0.7)
Eosinophils Relative: 2 % (ref 0.0–5.0)
HCT: 39.9 % (ref 36.0–46.0)
Hemoglobin: 13 g/dL (ref 12.0–15.0)
Lymphocytes Relative: 18.6 % (ref 12.0–46.0)
Lymphs Abs: 1.2 10*3/uL (ref 0.7–4.0)
MCHC: 32.6 g/dL (ref 30.0–36.0)
MCV: 102.7 fl — ABNORMAL HIGH (ref 78.0–100.0)
Monocytes Absolute: 0.6 10*3/uL (ref 0.1–1.0)
Monocytes Relative: 9.4 % (ref 3.0–12.0)
Neutro Abs: 4.4 10*3/uL (ref 1.4–7.7)
Neutrophils Relative %: 69.5 % (ref 43.0–77.0)
Platelets: 308 10*3/uL (ref 150.0–400.0)
RBC: 3.88 Mil/uL (ref 3.87–5.11)
RDW: 14 % (ref 11.5–15.5)
WBC: 6.4 10*3/uL (ref 4.0–10.5)

## 2023-06-17 LAB — COMPREHENSIVE METABOLIC PANEL
ALT: 22 U/L (ref 0–35)
AST: 13 U/L (ref 0–37)
Albumin: 3.8 g/dL (ref 3.5–5.2)
Alkaline Phosphatase: 86 U/L (ref 39–117)
BUN: 11 mg/dL (ref 6–23)
CO2: 34 mEq/L — ABNORMAL HIGH (ref 19–32)
Calcium: 9.5 mg/dL (ref 8.4–10.5)
Chloride: 102 mEq/L (ref 96–112)
Creatinine, Ser: 0.74 mg/dL (ref 0.40–1.20)
GFR: 90.14 mL/min (ref >60.00)
Glucose, Bld: 96 mg/dL (ref 70–99)
Potassium: 4.4 mEq/L (ref 3.5–5.1)
Sodium: 141 mEq/L (ref 135–145)
Total Bilirubin: 0.6 mg/dL (ref 0.2–1.2)
Total Protein: 6.6 g/dL (ref 6.0–8.3)

## 2023-06-17 LAB — HEMOGLOBIN A1C: Hgb A1c MFr Bld: 5.6 % (ref 4.6–6.5)

## 2023-06-17 LAB — VITAMIN D 25 HYDROXY (VIT D DEFICIENCY, FRACTURES): VITD: 11.1 ng/mL — ABNORMAL LOW (ref 30.00–100.00)

## 2023-06-17 LAB — TSH: TSH: 0.75 u[IU]/mL (ref 0.35–5.50)

## 2023-06-24 ENCOUNTER — Ambulatory Visit (INDEPENDENT_AMBULATORY_CARE_PROVIDER_SITE_OTHER): Payer: BC Managed Care – PPO | Admitting: Family Medicine

## 2023-06-24 ENCOUNTER — Encounter: Payer: Self-pay | Admitting: Family Medicine

## 2023-06-24 VITALS — BP 112/72 | HR 78 | Temp 97.8°F | Ht 63.5 in | Wt 234.1 lb

## 2023-06-24 DIAGNOSIS — R7309 Other abnormal glucose: Secondary | ICD-10-CM

## 2023-06-24 DIAGNOSIS — Z23 Encounter for immunization: Secondary | ICD-10-CM | POA: Diagnosis not present

## 2023-06-24 DIAGNOSIS — R1013 Epigastric pain: Secondary | ICD-10-CM

## 2023-06-24 DIAGNOSIS — K219 Gastro-esophageal reflux disease without esophagitis: Secondary | ICD-10-CM

## 2023-06-24 DIAGNOSIS — Z0000 Encounter for general adult medical examination without abnormal findings: Secondary | ICD-10-CM

## 2023-06-24 DIAGNOSIS — E785 Hyperlipidemia, unspecified: Secondary | ICD-10-CM

## 2023-06-24 DIAGNOSIS — E559 Vitamin D deficiency, unspecified: Secondary | ICD-10-CM

## 2023-06-24 DIAGNOSIS — R718 Other abnormality of red blood cells: Secondary | ICD-10-CM

## 2023-06-24 DIAGNOSIS — Z79899 Other long term (current) drug therapy: Secondary | ICD-10-CM

## 2023-06-24 DIAGNOSIS — R109 Unspecified abdominal pain: Secondary | ICD-10-CM

## 2023-06-24 DIAGNOSIS — R002 Palpitations: Secondary | ICD-10-CM

## 2023-06-24 DIAGNOSIS — R351 Nocturia: Secondary | ICD-10-CM

## 2023-06-24 DIAGNOSIS — R4586 Emotional lability: Secondary | ICD-10-CM

## 2023-06-24 NOTE — Assessment & Plan Note (Signed)
Still stuggling with epigastric pain and nausea  Continues omep 20 mg daily  Also carafate Encouraged to follow up with GI Also eating better and starting a diet journal

## 2023-06-24 NOTE — Assessment & Plan Note (Signed)
B12 is low normal  D is low  Discussed supplementation   Also GI f/u

## 2023-06-24 NOTE — Progress Notes (Signed)
Subjective:    Patient ID: Briana Burnett, female    DOB: 04-24-1966, 57 y.o.   MRN: [id number]  HPI  Here for health maintenance exam and to review chronic medical problems   Wt Readings from Last 3 Encounters:  06/24/23 234 lb 2 oz (106.2 kg)  06/01/23 231 lb 8 oz (105 kg)  04/23/22 237 lb 6 oz (107.7 kg)   40.82 kg/m  Vitals:   06/24/23 1421  BP: 112/72  Pulse: 78  Temp: 97.8 F (36.6 C)  SpO2: 96%    Immunization History  Administered Date(s) Administered   Influenza Whole 12/18/2009   Influenza,inj,Quad PF,6+ Mos 01/20/2016, 01/22/2019   Td 06/24/2023   Tdap 03/13/2012    Health Maintenance Due  Topic Date Due   Hepatitis C Screening  Never done   MAMMOGRAM  02/20/2023   Tetanus shot : - due   Mammogram-scheduled for 06/30/23 Self breast exam: no lumps   Has had a hysterectomy   Colon cancer screening -colonoscopy 10/2017 - 10 y recall   Bone health  On ppi Falls: none Fractures: none  Supplements  Last vitamin D Lab Results  Component Value Date   VD25OH 11.10 (L) 06/17/2023   Will start on D3 for bones   Exercise : doing a bit more lately  (She has chronic pain issues)  Some walking  Has been doing some leg exercises on a weight bench    Had CT for epigastric pain  IMPRESSION:  1. No CT evidence of acute abdominal/pelvic process.  2. 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   Still stays nauseated all the time Carafate helps the pain some of the time  Eating small amounts  Back hurts   Planning to follow up with her GI Plans to keep a diet journal      Mood    04/23/2022    3:14 PM 02/19/2020    9:47 AM 01/22/2019    4:36 PM  Depression screen PHQ 2/9  Decreased Interest 1 0 0  Down, Depressed, Hopeless 1 1 1   PHQ - 2 Score 2 1 1   Altered sleeping 3    Tired, decreased energy 2    Change in appetite 1    Feeling bad or failure about  yourself  1    Trouble concentrating 2    Suicidal thoughts 1    PHQ-9 Score 12     GERD Takes omeprazole 20 mg daily  Carafate   Lab Results  Component Value Date   VITAMINB12 281 06/17/2023    Hyperlipidemia Lab Results  Component Value Date   CHOL 159 06/17/2023   CHOL 166 04/23/2022   CHOL 171 02/12/2020   Lab Results  Component Value Date   HDL 44.60 06/17/2023   HDL 50 04/23/2022   HDL 40.30 02/12/2020   Lab Results  Component Value Date   LDLCALC 100 (H) 06/17/2023   LDLCALC 101 (H) 04/23/2022   LDLCALC 117 (H) 02/12/2020   Lab Results  Component Value Date   TRIG 73.0 06/17/2023   TRIG 65 04/23/2022   TRIG 65.0 02/12/2020   Lab Results  Component Value Date   CHOLHDL 4 06/17/2023   CHOLHDL 3.3 04/23/2022   CHOLHDL 4 02/12/2020   No results found for: "LDLDIRECT"  Glucose Lab Results  Component Value Date   HGBA1C 5.6 06/17/2023   Stable   Is trying to eat lower glycemic  Lab  Results  Component Value Date   WBC 6.4 06/17/2023   HGB 13.0 06/17/2023   HCT 39.9 06/17/2023   MCV 102.7 (H) 06/17/2023   PLT 308.0 06/17/2023   Lab Results  Component Value Date   NA 141 06/17/2023   K 4.4 06/17/2023   CO2 34 (H) 06/17/2023   GLUCOSE 96 06/17/2023   BUN 11 06/17/2023   CREATININE 0.74 06/17/2023   CALCIUM 9.5 06/17/2023   GFR 90.14 06/17/2023   GFRNONAA >60 07/23/2021   Lab Results  Component Value Date   ALT 22 06/17/2023   AST 13 06/17/2023   ALKPHOS 86 06/17/2023   BILITOT 0.6 06/17/2023    Lab Results  Component Value Date   TSH 0.75 06/17/2023       Patient Active Problem List   Diagnosis Date Noted   Vitamin D deficiency 06/24/2023   Current use of proton pump inhibitor 04/23/2022   GERD (gastroesophageal reflux disease) 04/07/2022   Right flank pain 04/07/2022   Elevated MCV 02/19/2020   Mild hyperlipidemia 02/19/2020   Elevated glucose level 02/11/2020   Encounter for screening mammogram for breast cancer  01/22/2019   Chronic right shoulder pain 01/22/2019   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   Epigastric pain 08/22/2017   Mood change 05/21/2015   Morbid obesity (HCC) 05/21/2015   Nocturia 07/24/2013   Allergic rhinitis 03/13/2012   Routine general medical examination at a health care facility 11/25/2011   New daily persistent headache 12/18/2009   FATIGUE 02/04/2009   Palpitations 08/16/2007   Past Medical History:  Diagnosis Date   Allergy    Anemia    h/o   Complication of anesthesia    Endometriosis    GERD (gastroesophageal reflux disease)    Headache    Heart palpitations    has seen dr Mariah Milling   Low blood pressure, not hypotension    PONV (postoperative nausea and vomiting)    nasuea   Stress incontinence    Past Surgical History:  Procedure Laterality Date   ABDOMINAL HYSTERECTOMY     BREAST EXCISIONAL BIOPSY Right    neg    MRI  05/12/2000   brain was neg   PLANTAR FASCIA SURGERY Left    SHOULDER ARTHROSCOPY WITH ROTATOR CUFF REPAIR Right 04/03/2018   Procedure: Right SHOULDER ARTHROSCOPY WITH ROTATOR CUFF REPAIR, distal clavicle excision, decompression and biceps tenotomy;  Surgeon: Lyndle Herrlich, MD;  Location: [facility];  Service: Orthopedics;  Laterality: Right;   TONSILLECTOMY     age 51   Social History   Tobacco Use   Smoking status: Never   Smokeless tobacco: Never  Vaping Use   Vaping status: Never Used  Substance Use Topics   Alcohol use: No    Alcohol/week: 0.0 standard drinks of alcohol   Drug use: No   Family History  Problem Relation Age of Onset   COPD Mother    Pulmonary embolism Mother    Irritable bowel syndrome Mother    Cancer Sister 15       breast ca   Breast cancer Sister 65   Heart disease Father    Mental illness Father 39       dementia   COPD Father    Alcohol abuse Father    Cancer Maternal Aunt        breast ca   Breast cancer Maternal Aunt 45   Heart disease Maternal Uncle    Crohn's disease Maternal Uncle    Heart  disease Maternal Grandmother  Cancer Maternal Aunt        breast ca   Breast cancer Maternal Aunt 60   Allergies  Allergen Reactions   Nsaids     Other reaction(s): Other Blisters in mouth, blurred vision   Celebrex [Celecoxib] Other (See Comments)    Leg pain and oral blisters    Cortisone Other (See Comments)    Cortisone shot caused uvula swelling, blisters in throat   Ibuprofen Other (See Comments)    Several doses causes blurred vision and light headed. PT states it only affects here if she takes several in a short amount of time.    Meloxicam    Current Outpatient Medications on File Prior to Visit  Medication Sig Dispense Refill   cholecalciferol (VITAMIN D3) 25 MCG (1000 UNIT) tablet Take 4,000 Units by mouth daily.     gabapentin (NEURONTIN) 100 MG capsule Take 300 mg by mouth in the morning, at noon, in the evening, and at bedtime.     omeprazole (PRILOSEC) 20 MG capsule TAKE 1 CAPSULE BY MOUTH EVERY DAY AS NEEDED 90 capsule 1   ondansetron (ZOFRAN) 4 MG tablet Take 1 tablet (4 mg total) by mouth every 8 (eight) hours as needed for nausea or vomiting. Caution of sedation 20 tablet 1   sucralfate (CARAFATE) 1 g tablet Take 1 tablet (1 g total) by mouth 4 (four) times daily -  with meals and at bedtime. 60 tablet 0   No current facility-administered medications on file prior to visit.    Review of Systems  Constitutional:  Negative for activity change, appetite change, fatigue, fever and unexpected weight change.  HENT:  Negative for congestion, ear pain, rhinorrhea, sinus pressure and sore throat.   Eyes:  Negative for pain, redness and visual disturbance.  Respiratory:  Negative for cough, shortness of breath and wheezing.   Cardiovascular:  Negative for chest pain and palpitations.  Gastrointestinal:  Positive for abdominal pain and nausea. Negative for abdominal distention, blood in stool, constipation, diarrhea and vomiting.  Endocrine: Negative for polydipsia and  polyuria.  Genitourinary:  Negative for dysuria, frequency and urgency.  Musculoskeletal:  Positive for arthralgias and back pain. Negative for myalgias.  Skin:  Negative for pallor and rash.  Allergic/Immunologic: Negative for environmental allergies.  Neurological:  Negative for dizziness, syncope and headaches.  Hematological:  Negative for adenopathy. Does not bruise/bleed easily.  Psychiatric/Behavioral:  Negative for decreased concentration and dysphoric mood. The patient is not nervous/anxious.        Objective:   Physical Exam Constitutional:      General: She is not in acute distress.    Appearance: Normal appearance. She is well-developed. She is obese. She is not ill-appearing or diaphoretic.  HENT:     Head: Normocephalic and atraumatic.     Right Ear: Tympanic membrane, ear canal and external ear normal.     Left Ear: Tympanic membrane, ear canal and external ear normal.     Nose: Nose normal. No congestion.     Mouth/Throat:     Mouth: Mucous membranes are moist.     Pharynx: Oropharynx is clear. No posterior oropharyngeal erythema.  Eyes:     General: No scleral icterus.    Extraocular Movements: Extraocular movements intact.     Conjunctiva/sclera: Conjunctivae normal.     Pupils: Pupils are equal, round, and reactive to light.  Neck:     Thyroid: No thyromegaly.     Vascular: No carotid bruit or JVD.  Cardiovascular:  Rate and Rhythm: Normal rate and regular rhythm.     Pulses: Normal pulses.     Heart sounds: Normal heart sounds.     No gallop.  Pulmonary:     Effort: Pulmonary effort is normal. No respiratory distress.     Breath sounds: Normal breath sounds. No wheezing.     Comments: Good air exch Chest:     Chest wall: No tenderness.  Abdominal:     General: Bowel sounds are normal. There is no distension or abdominal bruit.     Palpations: Abdomen is soft. There is no mass.     Tenderness: There is abdominal tenderness in the right upper  quadrant and left upper quadrant. There is no right CVA tenderness, left CVA tenderness, guarding or rebound. Negative signs include Murphy's sign.     Hernia: No hernia is present.     Comments: Mild upper abd tenderness on deep palpation   Genitourinary:    Comments: Breast exam: No mass, nodules, thickening, tenderness, bulging, retraction, inflamation, nipple discharge or skin changes noted.  No axillary or clavicular LA.     Musculoskeletal:        General: No tenderness. Normal range of motion.     Cervical back: Normal range of motion and neck supple. No rigidity. No muscular tenderness.     Right lower leg: No edema.     Left lower leg: No edema.     Comments: No kyphosis   Lymphadenopathy:     Cervical: No cervical adenopathy.  Skin:    General: Skin is warm and dry.     Coloration: Skin is not pale.     Findings: No erythema or rash.     Comments: Solar lentigines diffusely   Neurological:     Mental Status: She is alert. Mental status is at baseline.     Cranial Nerves: No cranial nerve deficit.     Motor: No abnormal muscle tone.     Coordination: Coordination normal.     Gait: Gait normal.     Deep Tendon Reflexes: Reflexes are normal and symmetric. Reflexes normal.  Psychiatric:        Mood and Affect: Mood normal.        Cognition and Memory: Cognition and memory normal.           Assessment & Plan:   Problem List Items Addressed This Visit       Digestive   GERD (gastroesophageal reflux disease)    Still stuggling with epigastric pain and nausea  Continues omep 20 mg daily  Also carafate Encouraged to follow up with GI Also eating better and starting a diet journal        Other   Vitamin D deficiency    Last vitamin D Lab Results  Component Value Date   VD25OH 11.10 (L) 06/17/2023   Instructed to get vit D3 and take 4000 international units daily  Discussed importance to bone and overall health      Routine general medical examination at a  health care facility - Primary    Reviewed health habits including diet and exercise and skin cancer prevention Reviewed appropriate screening tests for age  Also reviewed health mt list, fam hx and immunization status , as well as social and family history   See HPI Labs reviewed and ordered Td updated today  Mammogram scheduled this month  Colonoscopy 2018 with 10 y recall  Reviewed habits for bone health/ will start D3 nda exercise  PHQ of 12 - due to issues with chronic pain and sleep  B12 and D levels are mildly low- will supplement these       Right flank pain    Ongoing  CT scan reassuring  Will plan follow up with GI  Goes to chronic pain specialist as well for right arm/axillary pain      Palpitations    Per pt better recently       Nocturia    Ongoing Per pt has bladder spasms   She has seen urology      Morbid obesity [facility])    Discussed how this problem influences overall health and the risks it imposes  Reviewed plan for weight loss with lower calorie diet (via better food choices (lower glycemic and portion control) along with exercise building up to or more than 30 minutes 5 days per week including some aerobic activity and strength training   Chronic pain does limit her with exercise       Mood change    PHQ score of 12 Some fatigue and chronic pain and health issues  Pt has not wanted to treat so far  Mood does seem better today       Mild hyperlipidemia    Disc goals for lipids and reasons to control them Rev last labs with pt Rev low sat fat diet in detail Fairly stable More exercise as tolerated would help raise HDL      Epigastric pain    Ongoing  Some improvement with addn of carafate  Taking omeprazole Chronic nausea also=taking zofran Strongly encouraged follow up with GI -she plans to schedule this and also keep a diet journal  CT scan was reassuring- reviewed today  Eating smaller meals  Has had one episode of low abd cramping  with hard stool followed by loose stol        Elevated MCV    Lab Results  Component Value Date   VITAMINB12 281 06/17/2023   No etoh   Encouraged to start B12 500 mcg daily      Elevated glucose level    Lab Results  Component Value Date   HGBA1C 5.6 06/17/2023   Stable disc imp of low glycemic diet and wt loss to prevent DM2       Current use of proton pump inhibitor    B12 is low normal  D is low  Discussed supplementation   Also GI f/u      Other Visit Diagnoses     Need for Td vaccine       Relevant Orders   Td : Tetanus/diphtheria >7yo Preservative  free (Completed)

## 2023-06-24 NOTE — Assessment & Plan Note (Signed)
Per pt better recently

## 2023-06-24 NOTE — Assessment & Plan Note (Signed)
Discussed how this problem influences overall health and the risks it imposes  Reviewed plan for weight loss with lower calorie diet (via better food choices (lower glycemic and portion control) along with exercise building up to or more than 30 minutes 5 days per week including some aerobic activity and strength training   Chronic pain does limit her with exercise

## 2023-06-24 NOTE — Assessment & Plan Note (Signed)
Ongoing  CT scan reassuring  Will plan follow up with GI  Goes to chronic pain specialist as well for right arm/axillary pain

## 2023-06-24 NOTE — Assessment & Plan Note (Signed)
Lab Results  Component Value Date   VITAMINB12 281 06/17/2023   No etoh   Encouraged to start B12 500 mcg daily

## 2023-06-24 NOTE — Assessment & Plan Note (Signed)
Disc goals for lipids and reasons to control them Rev last labs with pt Rev low sat fat diet in detail Fairly stable More exercise as tolerated would help raise HDL

## 2023-06-24 NOTE — Assessment & Plan Note (Signed)
Ongoing Per pt has bladder spasms   She has seen urology

## 2023-06-24 NOTE — Assessment & Plan Note (Signed)
Lab Results  Component Value Date   HGBA1C 5.6 06/17/2023   Stable disc imp of low glycemic diet and wt loss to prevent DM2

## 2023-06-24 NOTE — Assessment & Plan Note (Signed)
Last vitamin D Lab Results  Component Value Date   VD25OH 11.10 (L) 06/17/2023   Instructed to get vit D3 and take 4000 international units daily  Discussed importance to bone and overall health

## 2023-06-24 NOTE — Assessment & Plan Note (Signed)
PHQ score of 12 Some fatigue and chronic pain and health issues  Pt has not wanted to treat so far  Mood does seem better today

## 2023-06-24 NOTE — Assessment & Plan Note (Signed)
Ongoing  Some improvement with addn of carafate  Taking omeprazole Chronic nausea also=taking zofran Strongly encouraged follow up with GI -she plans to schedule this and also keep a diet journal  CT scan was reassuring- reviewed today  Eating smaller meals  Has had one episode of low abd cramping with hard stool followed by loose stol

## 2023-06-24 NOTE — Assessment & Plan Note (Signed)
Reviewed health habits including diet and exercise and skin cancer prevention Reviewed appropriate screening tests for age  Also reviewed health mt list, fam hx and immunization status , as well as social and family history   See HPI Labs reviewed and ordered Td updated today  Mammogram scheduled this month  Colonoscopy 2018 with 10 y recall  Reviewed habits for bone health/ will start D3 nda exercise  PHQ of 12 - due to issues with chronic pain and sleep  B12 and D levels are mildly low- will supplement these

## 2023-06-24 NOTE — Patient Instructions (Addendum)
Please start vitamin D3 - 4000 international units daily    For exercise  Low impact cardio is good  Add some strength training to your routine, this is important for bone and brain health and can reduce your risk of falls and help your body use insulin properly and regulate weight  Light weights, exercise bands , and internet videos are a good way to start  Yoga (chair or regular), machines , floor exercises or a gym with machines are also good options   Keep a diet journal Follow up with GI for your nausea and abdominal pain   Your B12 is in the low normal range Get vitamin B12 over the counter and take 500 mcg daily   Try to get most of your carbohydrates from produce (with the exception of white potatoes)  Some whole grains are good  Eat less bread/pasta/rice/snack foods/cereals/sweets and other items from the middle of the grocery store (processed carbs)  0

## 2023-06-30 ENCOUNTER — Other Ambulatory Visit: Payer: Self-pay | Admitting: Family Medicine

## 2023-06-30 ENCOUNTER — Ambulatory Visit: Payer: BC Managed Care – PPO

## 2023-06-30 NOTE — Telephone Encounter (Signed)
Last filled on 06/01/23 #60 tabs/ 0 refills, CPE was on 06/24/23

## 2023-07-03 ENCOUNTER — Ambulatory Visit
Admission: RE | Admit: 2023-07-03 | Discharge: 2023-07-03 | Disposition: A | Discharge status: Home / Self Care | Payer: BC Managed Care – PPO | Source: Ambulatory Visit | Attending: Emergency Medicine | Admitting: Emergency Medicine

## 2023-07-03 VITALS — BP 128/85 | HR 74 | Temp 98.7°F | Resp 18 | Wt 221.0 lb

## 2023-07-03 DIAGNOSIS — U071 COVID-19: Secondary | ICD-10-CM | POA: Diagnosis not present

## 2023-07-03 LAB — SARS CORONAVIRUS 2 BY RT PCR: SARS Coronavirus 2 by RT PCR: POSITIVE — AB

## 2023-07-03 MED ORDER — ALBUTEROL SULFATE HFA 108 (90 BASE) MCG/ACT IN AERS: 2.0000 | INHALATION_SPRAY | RESPIRATORY_TRACT | 0 refills | Status: DC | PRN

## 2023-07-03 MED ORDER — NIRMATRELVIR/RITONAVIR (PAXLOVID)TABLET: 3.0000 | ORAL_TABLET | Freq: Two times a day (BID) | ORAL | 0 refills | Status: AC

## 2023-07-03 MED ORDER — NIRMATRELVIR/RITONAVIR (PAXLOVID)TABLET: 3.0000 | ORAL_TABLET | Freq: Two times a day (BID) | ORAL | 0 refills | Status: DC

## 2023-07-03 MED ORDER — IPRATROPIUM BROMIDE 0.06 % NA SOLN: 2.0000 | Freq: Four times a day (QID) | NASAL | 12 refills | Status: DC

## 2023-07-03 MED ORDER — PROMETHAZINE-DM 6.25-15 MG/5ML PO SYRP: 5.0000 mL | ORAL_SOLUTION | Freq: Four times a day (QID) | ORAL | 0 refills | Status: DC | PRN

## 2023-07-03 MED ORDER — BENZONATATE 100 MG PO CAPS: 200.0000 mg | ORAL_CAPSULE | Freq: Three times a day (TID) | ORAL | 0 refills | Status: DC

## 2023-07-03 MED ORDER — AEROCHAMBER MV MISC: 2 refills | Status: DC

## 2023-07-03 NOTE — Discharge Instructions (Signed)
CDC guidelines state that you must wear a mask for the first 5 days of symptoms when you are around other people.  After 5 days you no longer need to mask as you are no longer considered infectious.  There is no longer need to quarantine unless you have a fever.  If you do have a fever then you need to quarantine until you have been fever free for 24 hours without taking Tylenol and/or ibuprofen.  Use over-the-counter Tylenol and/or ibuprofen according to the package instructions as needed for fever and pain.  Use the Atrovent nasal spray, 2 squirts up each nostril every 6 hours, as needed for nasal congestion and runny nose.  Use the Tessalon Perles every 8 hours during the day as needed for cough.  Take them with a small sip of water.  You may experience numbness to the base of your tongue or metallic taste in her mouth, this is normal.  Use the Promethazine DM cough syrup at bedtime as needed for cough and congestion.  Be mindful this medication will make you sleepy.  If you have been prescribed Paxlovid take it twice daily as prescribed for 5 days.  Use the albuterol inhaler, with a spacer, 1 to 2 puffs every 4-6 hours as needed for shortness of breath or wheezing.  If you develop any worsening respiratory symptoms such as shortness of breath, shortness of breath at rest, feel as though you cannot catch your breath, you are unable to speak in full sentences, or, as a late sign, your lips begin turning blue you need to call 911 and go to the ER for evaluation.

## 2023-07-03 NOTE — ED Provider Notes (Signed)
[facility]    CSN: [id number] Arrival date & time: 07/03/23  1130      History   Chief Complaint Chief Complaint  Patient presents with   Cough    Chills cough fever headache - Entered by patient    HPI Briana Burnett is a 57 y.o. female.   HPI  57 year old female with a past medical history significant for heart palpitations, GERD, anemia, and stress incontinence presents for evaluation of 3 days worth of COVID symptoms to include subjective fever, runny nose, nasal congestion, headache, sore throat, cough, and shortness of breath with exertion.  She denies wheezing or GI symptoms.  3 family members have tested positive for COVID.  Past Medical History:  Diagnosis Date   Allergy    Anemia    h/o   Complication of anesthesia    Endometriosis    GERD (gastroesophageal reflux disease)    Headache    Heart palpitations    has seen dr Mariah Milling   Low blood pressure, not hypotension    PONV (postoperative nausea and vomiting)    nasuea   Stress incontinence     Patient Active Problem List   Diagnosis Date Noted   Vitamin D deficiency 06/24/2023   Current use of proton pump inhibitor 04/23/2022   GERD (gastroesophageal reflux disease) 04/07/2022   Right flank pain 04/07/2022   Elevated MCV 02/19/2020   Mild hyperlipidemia 02/19/2020   Elevated glucose level 02/11/2020   Encounter for screening mammogram for breast cancer 01/22/2019   Chronic right shoulder pain 01/22/2019   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   Epigastric pain 08/22/2017   Mood change 05/21/2015   Morbid obesity (HCC) 05/21/2015   Nocturia 07/24/2013   Allergic rhinitis 03/13/2012   Routine general medical examination at a health care facility 11/25/2011   New daily persistent headache 12/18/2009   FATIGUE 02/04/2009   Palpitations 08/16/2007    Past Surgical History:  Procedure Laterality Date   ABDOMINAL HYSTERECTOMY     BREAST EXCISIONAL BIOPSY Right    neg    MRI  05/12/2000    brain was neg   PLANTAR FASCIA SURGERY Left    SHOULDER ARTHROSCOPY WITH ROTATOR CUFF REPAIR Right 04/03/2018   Procedure: Right SHOULDER ARTHROSCOPY WITH ROTATOR CUFF REPAIR, distal clavicle excision, decompression and biceps tenotomy;  Surgeon: Lyndle Herrlich, MD;  Location: [facility];  Service: Orthopedics;  Laterality: Right;   TONSILLECTOMY     age 78    OB History   No obstetric history on file.      Home Medications    Prior to Admission medications   Medication Sig Start Date End Date Taking? Authorizing Provider  albuterol (VENTOLIN HFA) 108 (90 Base) MCG/ACT inhaler Inhale 2 puffs into the lungs every 4 (four) hours as needed. 07/03/23  Yes Becky Augusta, NP  benzonatate (TESSALON) 100 MG capsule Take 2 capsules (200 mg total) by mouth every 8 (eight) hours. 07/03/23  Yes Becky Augusta, NP  cholecalciferol (VITAMIN D3) 25 MCG (1000 UNIT) tablet Take 4,000 Units by mouth daily.   Yes [provider]  gabapentin (NEURONTIN) 100 MG capsule Take 300 mg by mouth in the morning, at noon, in the evening, and at bedtime. 04/09/22 05/31/25 Yes [provider]  ipratropium (ATROVENT) 0.06 % nasal spray Place 2 sprays into both nostrils 4 (four) times daily. 07/03/23  Yes Becky Augusta, NP  nirmatrelvir/ritonavir (PAXLOVID) 20 x 150 MG & 10 x 100MG  TABS Take 3 tablets by mouth 2 (  two) times daily for 5 days. 07/03/23 07/08/23 Yes Becky Augusta, NP  omeprazole (PRILOSEC) 20 MG capsule TAKE 1 CAPSULE BY MOUTH EVERY DAY AS NEEDED 06/02/23  Yes Tower, Audrie Gallus, MD  promethazine-dextromethorphan (PROMETHAZINE-DM) 6.25-15 MG/5ML syrup Take 5 mLs by mouth 4 (four) times daily as needed. 07/03/23  Yes Becky Augusta, NP  Spacer/Aero-Holding Chambers (AEROCHAMBER MV) inhaler Use as instructed 07/03/23  Yes Becky Augusta, NP  sucralfate (CARAFATE) 1 g tablet TAKE 1 TABLET (1 G TOTAL) BY MOUTH 4 TIMES A DAY WITH MEALS AND AT BEDTIME 06/30/23  Yes Tower, Audrie Gallus, MD  ondansetron (ZOFRAN) 4 MG tablet Take  1 tablet (4 mg total) by mouth every 8 (eight) hours as needed for nausea or vomiting. Caution of sedation 06/01/23   Tower, Audrie Gallus, MD    Family History Family History  Problem Relation Age of Onset   COPD Mother    Pulmonary embolism Mother    Irritable bowel syndrome Mother    Cancer Sister 42       breast ca   Breast cancer Sister 50   Heart disease Father    Mental illness Father 49       dementia   COPD Father    Alcohol abuse Father    Cancer Maternal Aunt        breast ca   Breast cancer Maternal Aunt 45   Heart disease Maternal Uncle    Crohn's disease Maternal Uncle    Heart disease Maternal Grandmother    Cancer Maternal Aunt        breast ca   Breast cancer Maternal Aunt 73    Social History Social History   Tobacco Use   Smoking status: Never   Smokeless tobacco: Never  Vaping Use   Vaping status: Never Used  Substance Use Topics   Alcohol use: No    Alcohol/week: 0.0 standard drinks of alcohol   Drug use: No     Allergies   Nsaids, Celebrex [celecoxib], Cortisone, Ibuprofen, and Meloxicam   Review of Systems Review of Systems  Constitutional:  Positive for fever.  HENT:  Positive for congestion, rhinorrhea and sore throat. Negative for ear pain.   Respiratory:  Positive for cough and shortness of breath. Negative for wheezing.   Gastrointestinal:  Negative for diarrhea, nausea and vomiting.  Neurological:  Positive for headaches.     Physical Exam Triage Vital Signs ED Triage Vitals  Encounter Vitals Group     BP      Systolic BP Percentile      Diastolic BP Percentile      Pulse      Resp      Temp      Temp src      SpO2      Weight      Height      Head Circumference      Peak Flow      Pain Score      Pain Loc      Pain Education      Exclude from Growth Chart    No data found.  Updated Vital Signs BP 128/85 (BP Location: Right Arm)   Pulse 74   Temp 98.7 F (37.1 C) (Oral)   Resp 18   Wt 221 lb (100.2 kg)   SpO2  97%   BMI 38.53 kg/m   Visual Acuity Right Eye Distance:   Left Eye Distance:   Bilateral Distance:    Right Eye Near:  Left Eye Near:    Bilateral Near:     Physical Exam Vitals and nursing note reviewed.  Constitutional:      Appearance: Normal appearance. She is not ill-appearing.  HENT:     Head: Normocephalic and atraumatic.     Right Ear: Tympanic membrane, ear canal and external ear normal. There is no impacted cerumen.     Left Ear: Tympanic membrane, ear canal and external ear normal. There is no impacted cerumen.     Nose: Congestion and rhinorrhea present.     Comments: Nasal mucosa is erythematous edematous with scant clear discharge in both nares.    Mouth/Throat:     Mouth: Mucous membranes are moist.     Pharynx: Oropharynx is clear. Posterior oropharyngeal erythema present. No oropharyngeal exudate.     Comments: Mild erythema to the posterior oropharynx with clear postnasal drip. Cardiovascular:     Rate and Rhythm: Normal rate and regular rhythm.     Pulses: Normal pulses.     Heart sounds: Normal heart sounds. No murmur heard.    No friction rub. No gallop.  Pulmonary:     Effort: Pulmonary effort is normal.     Breath sounds: Normal breath sounds. No wheezing, rhonchi or rales.  Musculoskeletal:     Cervical back: Normal range of motion and neck supple. No tenderness.  Lymphadenopathy:     Cervical: No cervical adenopathy.  Skin:    General: Skin is warm and dry.     Capillary Refill: Capillary refill takes less than 2 seconds.     Findings: No rash.  Neurological:     General: No focal deficit present.     Mental Status: She is alert and oriented to person, place, and time.      UC Treatments / Results  Labs (all labs ordered are listed, but only abnormal results are displayed) Labs Reviewed  SARS CORONAVIRUS 2 BY RT PCR - Abnormal; Notable for the following components:      Result Value   SARS Coronavirus 2 by RT PCR POSITIVE (*)    All  other components within normal limits    EKG   Radiology No results found.  Procedures Procedures (including critical care time)  Medications Ordered in UC Medications - No data to display  Initial Impression / Assessment and Plan / UC Course  I have reviewed the triage vital signs and the nursing notes.  Pertinent labs & imaging results that were available during my care of the patient were reviewed by me and considered in my medical decision making (see chart for details).   Patient is a nontoxic-appearing 57 year old female presenting for evaluation of COVID-like symptoms that started 3 days ago as outlined in HPI above.  She does endorse shortness of breath on exertion but she is able to speak in full sentence without dyspnea or tachypnea.  Room air oxygen saturation is 97% and her triage respiratory was 18.  She is afebrile with normal temp of 98.7.  She reports that 3 family members that she has been around have recently tested positive for COVID.  Given her exposure and cluster of symptoms I will order COVID PCR.  COVID PCR is positive.  I will discharge patient on the diagnosis of COVID-19.  I will start her on Paxlovid twice daily for 5 days.  CMP from 06/17/2023 shows a GFR of 90.14.  I will also prescribe an albuterol inhaler with a spacer to help with shortness of breath.  Atrovent nasal  spray for nasal congestion along with Tessalon Perles and Promethazine DM cough syrup for cough and congestion.  Return and ER precautions reviewed.   Final Clinical Impressions(s) / UC Diagnoses   Final diagnoses:  COVID-19     Discharge Instructions      CDC guidelines state that you must wear a mask for the first 5 days of symptoms when you are around other people.  After 5 days you no longer need to mask as you are no longer considered infectious.  There is no longer need to quarantine unless you have a fever.  If you do have a fever then you need to quarantine until you have been  fever free for 24 hours without taking Tylenol and/or ibuprofen.  Use over-the-counter Tylenol and/or ibuprofen according to the package instructions as needed for fever and pain.  Use the Atrovent nasal spray, 2 squirts up each nostril every 6 hours, as needed for nasal congestion and runny nose.  Use the Tessalon Perles every 8 hours during the day as needed for cough.  Take them with a small sip of water.  You may experience numbness to the base of your tongue or metallic taste in her mouth, this is normal.  Use the Promethazine DM cough syrup at bedtime as needed for cough and congestion.  Be mindful this medication will make you sleepy.  If you have been prescribed Paxlovid take it twice daily as prescribed for 5 days.  Use the albuterol inhaler, with a spacer, 1 to 2 puffs every 4-6 hours as needed for shortness of breath or wheezing.  If you develop any worsening respiratory symptoms such as shortness of breath, shortness of breath at rest, feel as though you cannot catch your breath, you are unable to speak in full sentences, or, as a late sign, your lips begin turning blue you need to call 911 and go to the ER for evaluation.      ED Prescriptions     Medication Sig Dispense Auth. Provider   albuterol (VENTOLIN HFA) 108 (90 Base) MCG/ACT inhaler Inhale 2 puffs into the lungs every 4 (four) hours as needed. 18 g Becky Augusta, NP   Spacer/Aero-Holding Chambers (AEROCHAMBER MV) inhaler Use as instructed 1 each Becky Augusta, NP   benzonatate (TESSALON) 100 MG capsule Take 2 capsules (200 mg total) by mouth every 8 (eight) hours. 21 capsule Becky Augusta, NP   ipratropium (ATROVENT) 0.06 % nasal spray Place 2 sprays into both nostrils 4 (four) times daily. 15 mL Becky Augusta, NP   nirmatrelvir/ritonavir (PAXLOVID) 20 x 150 MG & 10 x 100MG  TABS Take 3 tablets by mouth 2 (two) times daily for 5 days. 30 tablet Becky Augusta, NP   promethazine-dextromethorphan (PROMETHAZINE-DM) 6.25-15  MG/5ML syrup Take 5 mLs by mouth 4 (four) times daily as needed. 118 mL Becky Augusta, NP      PDMP not reviewed this encounter.   Becky Augusta, NP 07/03/23 1224

## 2023-07-03 NOTE — ED Triage Notes (Signed)
Covid Exposure; Cough; sore throat; HA; Fever x 3 days . 3 family members in the home have covid.

## 2023-07-18 ENCOUNTER — Telehealth: Payer: Self-pay | Admitting: Family Medicine

## 2023-07-18 ENCOUNTER — Ambulatory Visit
Admission: EM | Admit: 2023-07-18 | Discharge: 2023-07-18 | Payer: BC Managed Care – PPO | Attending: Physician Assistant | Admitting: Physician Assistant

## 2023-07-18 DIAGNOSIS — R079 Chest pain, unspecified: Secondary | ICD-10-CM | POA: Diagnosis not present

## 2023-07-18 DIAGNOSIS — R109 Unspecified abdominal pain: Secondary | ICD-10-CM | POA: Insufficient documentation

## 2023-07-18 DIAGNOSIS — R300 Dysuria: Secondary | ICD-10-CM | POA: Insufficient documentation

## 2023-07-18 DIAGNOSIS — R519 Headache, unspecified: Secondary | ICD-10-CM | POA: Diagnosis not present

## 2023-07-18 DIAGNOSIS — M542 Cervicalgia: Secondary | ICD-10-CM | POA: Diagnosis not present

## 2023-07-18 DIAGNOSIS — I456 Pre-excitation syndrome: Secondary | ICD-10-CM | POA: Insufficient documentation

## 2023-07-18 DIAGNOSIS — R42 Dizziness and giddiness: Secondary | ICD-10-CM | POA: Insufficient documentation

## 2023-07-18 LAB — URINALYSIS, W/ REFLEX TO CULTURE (INFECTION SUSPECTED)
Bilirubin Urine: NEGATIVE
Glucose, UA: NEGATIVE mg/dL
Hgb urine dipstick: NEGATIVE
Ketones, ur: NEGATIVE mg/dL
Leukocytes,Ua: NEGATIVE
Nitrite: NEGATIVE
Protein, ur: NEGATIVE mg/dL
Specific Gravity, Urine: 1.02 (ref 1.005–1.030)
pH: 7 (ref 5.0–8.0)

## 2023-07-18 MED ORDER — KETOROLAC TROMETHAMINE 30 MG/ML IJ SOLN
30.0000 mg | Freq: Once | INTRAMUSCULAR | Status: AC
  Administered 2023-07-18: 30 mg via INTRAMUSCULAR

## 2023-07-18 NOTE — Telephone Encounter (Signed)
Reviewed patient chart she is at ED now.

## 2023-07-18 NOTE — Telephone Encounter (Signed)
Aware, will watch for correspondence  

## 2023-07-18 NOTE — ED Provider Notes (Signed)
[facility]    CSN: [id number] Arrival date & time: 07/18/23  1229      History   Chief Complaint Chief Complaint  Patient presents with   Dizziness   Headache   burning with urination    HPI Briana Burnett is a 57 y.o. female with history of obesity, GERD, hyperlipidemia, palpitations, hyperglycemia, chronic pain of right shoulder, anemia, endometriosis, stress incontinence and allergies.  Patient also reports history of COVID 2 weeks ago.  Patient presents today for left sided neck/shoulder pain and back pain that began yesterday. Neck pain is 8/10. Back pain is 3/10. Denies injury.  She says that she has had intermittent dizziness as well. Neck pain worsens with flexion/bending forward. States she had a "funny feeling" yesterday and noticed her heart rate was high, but does not recall how high. No vision changes, photophobia, pre-syncope/syncope, palpitations, chest pain, SOB, or leg swelling. No numbness/weakness, facial drooping, confusion.  She says she has had a history of palpitations and is followed up with cardiology about a year and a half ago about this.  She reports occasional palpitations here and there but has not had any recently.  She is reporting occasional dysuria over the past 2 weeks. Reports blood in urine yesterday. Mild left lower abdominal "shooting" pain intermittently. Denies history kidney stones.   Tried ibuprofen and Tylenol without relief. No meds taken today.  HPI  Past Medical History:  Diagnosis Date   Allergy    Anemia    h/o   Complication of anesthesia    Endometriosis    GERD (gastroesophageal reflux disease)    Headache    Heart palpitations    has seen dr Mariah Milling   Low blood pressure, not hypotension    PONV (postoperative nausea and vomiting)    nasuea   Stress incontinence     Patient Active Problem List   Diagnosis Date Noted   Vitamin D deficiency 06/24/2023   Current use of proton pump inhibitor 04/23/2022    GERD (gastroesophageal reflux disease) 04/07/2022   Right flank pain 04/07/2022   Elevated MCV 02/19/2020   Mild hyperlipidemia 02/19/2020   Elevated glucose level 02/11/2020   Encounter for screening mammogram for breast cancer 01/22/2019   Chronic right shoulder pain 01/22/2019   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   Epigastric pain 08/22/2017   Mood change 05/21/2015   Morbid obesity (HCC) 05/21/2015   Nocturia 07/24/2013   Allergic rhinitis 03/13/2012   Routine general medical examination at a health care facility 11/25/2011   New daily persistent headache 12/18/2009   FATIGUE 02/04/2009   Palpitations 08/16/2007    Past Surgical History:  Procedure Laterality Date   ABDOMINAL HYSTERECTOMY     BREAST EXCISIONAL BIOPSY Right    neg    MRI  05/12/2000   brain was neg   PLANTAR FASCIA SURGERY Left    SHOULDER ARTHROSCOPY WITH ROTATOR CUFF REPAIR Right 04/03/2018   Procedure: Right SHOULDER ARTHROSCOPY WITH ROTATOR CUFF REPAIR, distal clavicle excision, decompression and biceps tenotomy;  Surgeon: Lyndle Herrlich, MD;  Location: [facility];  Service: Orthopedics;  Laterality: Right;   TONSILLECTOMY     age 29    OB History   No obstetric history on file.      Home Medications    Prior to Admission medications   Medication Sig Start Date End Date Taking? Authorizing Provider  albuterol (VENTOLIN HFA) 108 (90 Base) MCG/ACT inhaler Inhale 2 puffs into the lungs every 4 (four) hours as  needed. 07/03/23   Becky Augusta, NP  benzonatate (TESSALON) 100 MG capsule Take 2 capsules (200 mg total) by mouth every 8 (eight) hours. 07/03/23   Becky Augusta, NP  cholecalciferol (VITAMIN D3) 25 MCG (1000 UNIT) tablet Take 4,000 Units by mouth daily.    [provider]  gabapentin (NEURONTIN) 100 MG capsule Take 300 mg by mouth in the morning, at noon, in the evening, and at bedtime. 04/09/22 05/31/25  [provider]  ipratropium (ATROVENT) 0.06 % nasal spray Place 2 sprays into both  nostrils 4 (four) times daily. 07/03/23   Becky Augusta, NP  omeprazole (PRILOSEC) 20 MG capsule TAKE 1 CAPSULE BY MOUTH EVERY DAY AS NEEDED 06/02/23   Tower, Audrie Gallus, MD  ondansetron (ZOFRAN) 4 MG tablet Take 1 tablet (4 mg total) by mouth every 8 (eight) hours as needed for nausea or vomiting. Caution of sedation 06/01/23   Tower, Audrie Gallus, MD  promethazine-dextromethorphan (PROMETHAZINE-DM) 6.25-15 MG/5ML syrup Take 5 mLs by mouth 4 (four) times daily as needed. 07/03/23   Becky Augusta, NP  Spacer/Aero-Holding Deretha Emory (AEROCHAMBER MV) inhaler Use as instructed 07/03/23   Becky Augusta, NP  sucralfate (CARAFATE) 1 g tablet TAKE 1 TABLET (1 G TOTAL) BY MOUTH 4 TIMES A DAY WITH MEALS AND AT BEDTIME 06/30/23   Tower, Audrie Gallus, MD    Family History Family History  Problem Relation Age of Onset   COPD Mother    Pulmonary embolism Mother    Irritable bowel syndrome Mother    Cancer Sister 68       breast ca   Breast cancer Sister 73   Heart disease Father    Mental illness Father 62       dementia   COPD Father    Alcohol abuse Father    Cancer Maternal Aunt        breast ca   Breast cancer Maternal Aunt 45   Heart disease Maternal Uncle    Crohn's disease Maternal Uncle    Heart disease Maternal Grandmother    Cancer Maternal Aunt        breast ca   Breast cancer Maternal Aunt 69    Social History Social History   Tobacco Use   Smoking status: Never   Smokeless tobacco: Never  Vaping Use   Vaping status: Never Used  Substance Use Topics   Alcohol use: No    Alcohol/week: 0.0 standard drinks of alcohol   Drug use: No     Allergies   Nsaids, Celebrex [celecoxib], Cortisone, Ibuprofen, and Meloxicam   Review of Systems Review of Systems  Constitutional:  Negative for fatigue and fever.  Respiratory:  Negative for cough and shortness of breath.   Cardiovascular:  Negative for chest pain.  Gastrointestinal:  Negative for abdominal pain, nausea and vomiting.  Genitourinary:   Positive for dysuria. Negative for difficulty urinating, flank pain and frequency.  Musculoskeletal:  Positive for back pain and neck pain. Negative for gait problem.  Neurological:  Positive for dizziness and headaches. Negative for syncope, weakness and numbness.     Physical Exam Triage Vital Signs ED Triage Vitals  Encounter Vitals Group     BP 07/18/23 1245 128/85     Systolic BP Percentile --      Diastolic BP Percentile --      Pulse Rate 07/18/23 1245 70     Resp 07/18/23 1245 16     Temp 07/18/23 1245 98.6 F (37 C)     Temp  Source 07/18/23 1245 Oral     SpO2 07/18/23 1245 97 %     Weight 07/18/23 1244 225 lb (102.1 kg)     Height --      Head Circumference --      Peak Flow --      Pain Score 07/18/23 1244 7     Pain Loc --      Pain Education --      Exclude from Growth Chart --    No data found.  Updated Vital Signs BP 128/85 (BP Location: Right Arm)   Pulse 70   Temp 98.6 F (37 C) (Oral)   Resp 16   Wt 225 lb (102.1 kg)   SpO2 97%   BMI 39.23 kg/m   Physical Exam Vitals and nursing note reviewed.  Constitutional:      General: She is not in acute distress.    Appearance: Normal appearance. She is well-developed. She is obese. She is not ill-appearing or toxic-appearing.     Comments: Appears uncomfortable. Often closes eyes and squints or hold head/neck.  HENT:     Head: Normocephalic and atraumatic.     Nose: Nose normal.     Mouth/Throat:     Mouth: Mucous membranes are moist.     Pharynx: Oropharynx is clear.  Eyes:     General: No scleral icterus.       Right eye: No discharge.        Left eye: No discharge.     Extraocular Movements: Extraocular movements intact.     Conjunctiva/sclera: Conjunctivae normal.     Pupils: Pupils are equal, round, and reactive to light.  Cardiovascular:     Rate and Rhythm: Normal rate and regular rhythm.     Heart sounds: Normal heart sounds.  Pulmonary:     Effort: Pulmonary effort is normal. No  respiratory distress.     Breath sounds: Normal breath sounds.  Musculoskeletal:     Cervical back: Neck supple.  Skin:    General: Skin is dry.  Neurological:     General: No focal deficit present.     Mental Status: She is alert and oriented to person, place, and time. Mental status is at baseline.     Cranial Nerves: No cranial nerve deficit.     Motor: No weakness.     Coordination: Coordination normal.     Gait: Gait normal.  Psychiatric:        Mood and Affect: Mood normal.        Behavior: Behavior normal.        Thought Content: Thought content normal.      UC Treatments / Results  Labs (all labs ordered are listed, but only abnormal results are displayed) Labs Reviewed  URINALYSIS, W/ REFLEX TO CULTURE (INFECTION SUSPECTED) - Abnormal; Notable for the following components:      Result Value   Bacteria, UA FEW (*)    All other components within normal limits    EKG   Radiology No results found.  Procedures ED EKG  Date/Time: 07/18/2023 1:55 PM  Performed by: Shirlee Latch, PA-C Authorized by: Shirlee Latch, PA-C   Previous ECG:    Previous ECG:  Unavailable Interpretation:    Interpretation: abnormal   Rate:    ECG rate:  67   ECG rate assessment: normal   Rhythm:    Rhythm: sinus rhythm   QRS:    QRS axis:  Normal   Details:  122 ms  ST segments:    ST segments:  Normal T waves:    T waves: normal   Other findings:    Other findings: delta wave   Comments:     Shortened PR interval, prolonged QRS, delta waves  (including critical care time)  Medications Ordered in UC Medications  ketorolac (TORADOL) 30 MG/ML injection 30 mg (30 mg Intramuscular Given 07/18/23 1348)    Initial Impression / Assessment and Plan / UC Course  I have reviewed the triage vital signs and the nursing notes.  Pertinent labs & imaging results that were available during my care of the patient were reviewed by me and considered in my medical decision making (see  chart for details).   57 year old female presents for left-sided neck and occipital headache, left low back pain, dizziness, fatigue that began yesterday.  No fever or URI symptoms.  Also reporting occasional dysuria over the past couple of weeks and blood in urine once yesterday.  Patient reports having palpitations in the past and being seen by cardiology but denies any previous diagnosis of arrhythmia other than frequent PVCs.  Last cardiology follow-up was about a year and a half ago.  Vitals all normal and stable.  She appears uncomfortable, often closing eyes, squinting or holding head/neck.  Normal cranial nerve exam.  Chest clear to auscultation heart regular rate and rhythm.  EKG performed today shows Wolff-Parkinson-White pattern with short PR interval, prolonged QRS and delta waves.  This is new.  Patient denies a history of this.  Urinalysis without any evidence of infection and no hematuria noted.  Patient given 30 mg IM ketorolac in clinic for acute headache relief.  Advised patient that since she has not new abnormality on her EKG with symptoms I advised follow-up in the emergency department as soon as possible.  She reports that her daughter will pick her up and take her to [facility].  We discussed that she needs further workup and cardiology follow-up.   Final Clinical Impressions(s) / UC Diagnoses   Final diagnoses:  Acute nonintractable headache, unspecified headache type  Neck pain  Dizziness  Evelene Croon Parkinson White pattern seen on electrocardiogram  Left flank pain  Dysuria     Discharge Instructions      -Given your new abnormality on the EKG along with your dizziness and severe headache/neck pain, I have suggested you go to the emergency department for further workup and cardiology consult.  You have been advised to follow up immediately in the emergency department for concerning signs.symptoms. If you declined EMS transport, please have a family member take you  directly to the ED at this time. Do not delay. Based on concerns about condition, if you do not follow up in th e ED, you may risk poor outcomes including worsening of condition, delayed treatment and potentially life threatening issues. If you have declined to go to the ED at this time, you should call your PCP immediately to set up a follow up appointment.  Go to ED for red flag symptoms, including; fevers you cannot reduce with Tylenol/Motrin, severe headaches, vision changes, numbness/weakness in part of the body, lethargy, confusion, intractable vomiting, severe dehydration, chest pain, breathing difficulty, severe persistent abdominal or pelvic pain, signs of severe infection (increased redness, swelling of an area), feeling faint or passing out, dizziness, etc. You should especially go to the ED for sudden acute worsening of condition if you do not elect to go at this time.      ED Prescriptions  None    I have reviewed the PDMP during this encounter.   Shirlee Latch, PA-C 07/18/23 2015

## 2023-07-18 NOTE — ED Triage Notes (Signed)
Patient states that she's having neck and back pain since yesterday. Headache with dizziness. UTI sx. Burning sometimes with urination. X 2 weeks

## 2023-07-18 NOTE — ED Notes (Signed)
Patient is being discharged from the Urgent Care and sent to the Emergency Department via personal vehicle . Per Athena Masse PA , patient is in need of higher level of care due to abnormal EKG. Patient is aware and verbalizes understanding of plan of care.  Vitals:   07/18/23 1245  BP: 128/85  Pulse: 70  Resp: 16  Temp: 98.6 F (37 C)  SpO2: 97%

## 2023-07-18 NOTE — Discharge Instructions (Signed)
-  Given your new abnormality on the EKG along with your dizziness and severe headache/neck pain, I have suggested you go to the emergency department for further workup and cardiology consult.  You have been advised to follow up immediately in the emergency department for concerning signs.symptoms. If you declined EMS transport, please have a family member take you directly to the ED at this time. Do not delay. Based on concerns about condition, if you do not follow up in th e ED, you may risk poor outcomes including worsening of condition, delayed treatment and potentially life threatening issues. If you have declined to go to the ED at this time, you should call your PCP immediately to set up a follow up appointment.  Go to ED for red flag symptoms, including; fevers you cannot reduce with Tylenol/Motrin, severe headaches, vision changes, numbness/weakness in part of the body, lethargy, confusion, intractable vomiting, severe dehydration, chest pain, breathing difficulty, severe persistent abdominal or pelvic pain, signs of severe infection (increased redness, swelling of an area), feeling faint or passing out, dizziness, etc. You should especially go to the ED for sudden acute worsening of condition if you do not elect to go at this time.

## 2023-07-20 ENCOUNTER — Telehealth: Payer: Self-pay

## 2023-07-20 ENCOUNTER — Telehealth: Payer: Self-pay | Admitting: Family Medicine

## 2023-07-20 NOTE — Telephone Encounter (Signed)
Patient would like a call back when ever possible regarding discharge from the hospital,can be reach at [phone]

## 2023-07-20 NOTE — Transitions of Care (Post Inpatient/ED Visit) (Signed)
07/20/2023  Name: Briana Burnett MRN: [id number] DOB: Jan 11, 1966  Today's TOC FU Call Status: Today's TOC FU Call Status:: Successful TOC FU Call Completed TOC FU Call Complete Date: 07/20/23  Transition Care Management Follow-up Telephone Call Date of Discharge: 07/19/23 Discharge Facility: Other ([facility]) Name of Other ([facility]) Discharge Facility: [facility] Type of Discharge: Inpatient Admission Primary Inpatient Discharge Diagnosis:: Wolff-Parkinson-White (WPW) pattern How have you been since you were released from the hospital?: Better Any questions or concerns?: No  Items Reviewed: Did you receive and understand the discharge instructions provided?: Yes Medications obtained,verified, and reconciled?: Yes (Medications Reviewed) Any new allergies since your discharge?: No Dietary orders reviewed?: Yes Do you have support at home?: Yes  Medications Reviewed Today: Medications Reviewed Today     Reviewed by Merleen Nicely, LPN (Licensed Practical Nurse) on 07/20/23 at 1012  Med List Status: <None>   Medication Order Taking? Sig Documenting Provider Last Dose Status Informant  albuterol (VENTOLIN HFA) 108 (90 Base) MCG/ACT inhaler [id number] No Inhale 2 puffs into the lungs every 4 (four) hours as needed.  Patient not taking: Reported on 07/20/2023   Becky Augusta, NP Not Taking Active   benzonatate (TESSALON) 100 MG capsule [id number] No Take 2 capsules (200 mg total) by mouth every 8 (eight) hours.  Patient not taking: Reported on 07/20/2023   Becky Augusta, NP Not Taking Active   cholecalciferol (VITAMIN D3) 25 MCG (1000 UNIT) tablet [id number] Yes Take 4,000 Units by mouth daily. [provider] Taking Active            Med Note Julieanne Manson, Lashana Spang H   Wed Jul 20, 2023 10:11 AM) Pt takes 2000iu daily    cyanocobalamin (VITAMIN B12) 1000 MCG tablet [id number] Yes Take 1,000 mcg by mouth daily. [provider] Taking Active   gabapentin  (NEURONTIN) 100 MG capsule [id number] No Take 300 mg by mouth in the morning, at noon, in the evening, and at bedtime.  Patient not taking: Reported on 07/20/2023   [provider] Not Taking Active   ipratropium (ATROVENT) 0.06 % nasal spray [id number] No Place 2 sprays into both nostrils 4 (four) times daily.  Patient not taking: Reported on 07/20/2023   Becky Augusta, NP Not Taking Active   omeprazole (PRILOSEC) 20 MG capsule [id number] Yes TAKE 1 CAPSULE BY MOUTH EVERY DAY AS NEEDED Tower, Audrie Gallus, MD Taking Active   ondansetron (ZOFRAN) 4 MG tablet [id number]  Take 1 tablet (4 mg total) by mouth every 8 (eight) hours as needed for nausea or vomiting. Caution of sedation Tower, Audrie Gallus, MD  Active   promethazine-dextromethorphan (PROMETHAZINE-DM) 6.25-15 MG/5ML syrup [id number] No Take 5 mLs by mouth 4 (four) times daily as needed.  Patient not taking: Reported on 07/20/2023   Becky Augusta, NP Not Taking Active   Spacer/Aero-Holding Chambers (AEROCHAMBER MV) inhaler [id number] Yes Use as instructed Becky Augusta, NP Taking Active   sucralfate (CARAFATE) 1 g tablet [id number]  TAKE 1 TABLET (1 G TOTAL) BY MOUTH 4 TIMES A DAY WITH MEALS AND AT BEDTIME Tower, Audrie Gallus, MD  Active             Home Care and Equipment/Supplies: Were Home Health Services Ordered?: No Any new equipment or medical supplies ordered?: No  Functional Questionnaire: Do you need assistance with bathing/showering or dressing?: No Do you need assistance with meal preparation?: No Do you need assistance with eating?: No Do you have difficulty maintaining continence: No Do you need assistance with  getting out of bed/getting out of a chair/moving?: No Do you have difficulty managing or taking your medications?: No  Follow up appointments reviewed: PCP Follow-up appointment confirmed?: Yes Date of PCP follow-up appointment?: 07/29/23 Follow-up Provider: [facility] Follow-up appointment confirmed?:  No Follow-Up Specialty Provider:: electrophysiologist Reason Specialist Follow-Up Not Confirmed: Patient has Specialist Provider Number and will Call for Appointment Do you need transportation to your follow-up appointment?: No Do you understand care options if your condition(s) worsen?: Yes-patient verbalized understanding    SIGNATURE  Woodfin Ganja LPN [facility] Nurse Health Advisor Direct Dial [phone]

## 2023-07-21 ENCOUNTER — Ambulatory Visit: Payer: BC Managed Care – PPO | Admitting: Family Medicine

## 2023-07-21 NOTE — Telephone Encounter (Signed)
Left VM requesting pt to call the office back 

## 2023-07-25 ENCOUNTER — Telehealth: Payer: Self-pay

## 2023-07-28 NOTE — Telephone Encounter (Signed)
Opened in error    Woodfin Ganja LPN [facility] Nurse Health Advisor Direct Dial [phone]

## 2023-07-29 ENCOUNTER — Ambulatory Visit: Payer: BC Managed Care – PPO | Admitting: Family Medicine

## 2023-07-29 ENCOUNTER — Encounter: Payer: Self-pay | Admitting: Family Medicine

## 2023-07-29 VITALS — BP 130/70 | HR 88 | Temp 98.2°F | Ht 63.5 in | Wt 237.4 lb

## 2023-07-29 DIAGNOSIS — K429 Umbilical hernia without obstruction or gangrene: Secondary | ICD-10-CM | POA: Insufficient documentation

## 2023-07-29 DIAGNOSIS — R002 Palpitations: Secondary | ICD-10-CM

## 2023-07-29 DIAGNOSIS — I456 Pre-excitation syndrome: Secondary | ICD-10-CM | POA: Insufficient documentation

## 2023-07-29 NOTE — Progress Notes (Unsigned)
Subjective:    Patient ID: Briana Burnett, female    DOB: 31-Jan-1966, 57 y.o.   MRN: [id number]  HPI  Wt Readings from Last 3 Encounters:  07/29/23 237 lb 6 oz (107.7 kg)  07/18/23 225 lb (102.1 kg)  07/03/23 221 lb (100.2 kg)   41.39 kg/m  Vitals:   07/29/23 1457 07/29/23 1536  BP: (!) 140/78 130/70  Pulse: 88   Temp: 98.2 F (36.8 C)   SpO2: 97%    Pt presents for follow up of recent hospitalization for WPW heart rhythm   The WPW was caught at [facility] in [place]  Presented with shoulder blade pain and nausea  Thought she had uti (urinalysis was fine) EKG was abnormal   Flecainide 50 mg was prescription but not able to tolerate due to headache  Made her weak     Had follow up with cardiology in [facility] Dr Toma Copier  Noted she is ok when she gets up but more dizzy / clammy and fast HR after moving  Has wide fluctuation in heart rate   Last few months more clammy in bed  Is normally cold intolerant    Echo Echo 07/19/23 1. The left ventricle is normal in size with probably normal wall thickness. 2. The left ventricular systolic function is normal with no obvious regional wall motion abnormalities, LVEF is visually estimated at > 55%.   Discussed invasive EP study to see where the intermittent ventricular pre excitation is coming from  Waiting to get that scheduled   Normal chem labs K 4.4 Cr 0.65 Hb 12.4  MCV 101.3   Lab Results  Component Value Date   WBC 6.4 06/17/2023   HGB 13.0 06/17/2023   HCT 39.9 06/17/2023   MCV 102.7 (H) 06/17/2023   PLT 308.0 06/17/2023    She watches her HR on her fitness watch   Not as many palpitations  Lab Results  Component Value Date   TSH 0.75 06/17/2023   Was told to start walking around yard some to get stamina back  Is using caution    CT of chest  [facility] Outside Information   CTA Chest/Abd (Aortic Dissection)  Anatomical Region Laterality Modality  Chest -- Computed Tomography  Abdomen  -- --  Vascular -- --   Impression   1.  No CT evidence of aortic dissection, as clinically questioned. 2.  Age-indeterminate anterior wedge compression deformities of the T8 and T9 vertebral bodies. Narrative  EXAM: CTA Chest, Abdomen, Pelvis for Aortic Dissection DATE: 07/21/2023 1:03 AM ACCESSION: [id number] UN DICTATED: 07/21/2023 1:08 AM INTERPRETATION LOCATION: MAIN CAMPUS  CLINICAL INDICATION: 57 years old Female with r/o dissection    COMPARISON: None  TECHNIQUE: A helical CT of the chest was obtained without IV contrast from the thoracic inlet through the hemidiaphragms. Images were reconstructed in the axial plane. Next, a spiral CTA  of the chest, abdomen and pelvis was obtained with IV contrast from the thoracic inlet through the aortic bifurcation. Images were reconstructed in the axial plane.  Multiplanar reformatted and MIP images are provided for further evaluation of the aorta.  FINDINGS:  AORTA: Normal caliber aorta. No thoracic aortic intramural hematoma.  No aortic dissection.  CHEST: Normal heart size.  No pericardial effusion. No mediastinal lymphadenopathy.   Clear central airways. No consolidation.  No pleural effusion.  ABDOMEN and PELVIS: HEPATOBILIARY: No focal hepatic lesions. The gallbladder is normal in appearance. No biliary dilatation.   SPLEEN: Unremarkable. PANCREAS: Mild fatty  pancreatic atrophy. No focal lesions. No ductal dilatation..  ADRENALS: Unremarkable. KIDNEYS/URETERS: Unremarkable.  BLADDER: Unremarkable. PELVIC/REPRODUCTIVE ORGANS: Unremarkable.  GI TRACT: No dilated or thick walled loops of bowel. Normal appendix. Scattered colonic diverticula without diverticulitis.  PERITONEUM/RETROPERITONEUM AND MESENTERY: No free air or fluid. LYMPH NODES: No enlarged lymph nodes.   BONES: Multilevel degenerative changes of the spine. Age-indeterminate anterior wedge compression deformities of the T8 and T9 vertebral bodies.  SOFT  TISSUES: Tiny fat-containing umbilical hernia.   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   Epigastric pain 08/22/2017   Mood change 05/21/2015   Morbid obesity (HCC) 05/21/2015   Nocturia 07/24/2013   Allergic rhinitis 03/13/2012   Routine general medical examination at a health care facility 11/25/2011   New daily persistent headache 12/18/2009   FATIGUE 02/04/2009   Palpitations 08/16/2007   Past Medical History:  Diagnosis Date   Allergy    Anemia    h/o   Complication of anesthesia    Endometriosis    GERD (gastroesophageal reflux disease)    Headache    Heart palpitations    has seen dr Mariah Milling   Low blood pressure, not hypotension    PONV (postoperative nausea and vomiting)    nasuea   Stress incontinence    Past Surgical History:  Procedure Laterality Date   ABDOMINAL HYSTERECTOMY     BREAST EXCISIONAL BIOPSY Right    neg    MRI  05/12/2000   brain was neg   PLANTAR FASCIA SURGERY Left    SHOULDER ARTHROSCOPY WITH ROTATOR CUFF REPAIR Right 04/03/2018   Procedure: Right SHOULDER ARTHROSCOPY WITH ROTATOR CUFF REPAIR, distal clavicle excision, decompression and biceps tenotomy;  Surgeon: Lyndle Herrlich, MD;  Location: [facility] ORS;  Service: Orthopedics;  Laterality: Right;   TONSILLECTOMY     age 21   Social History   Tobacco Use   Smoking status: Never   Smokeless tobacco: Never  Vaping Use   Vaping status: Never Used  Substance Use Topics   Alcohol use: No    Alcohol/week:  0.0 standard drinks of alcohol   Drug use: No   Family History  Problem Relation Age of Onset   COPD Mother    Pulmonary embolism Mother    Irritable bowel syndrome Mother    Cancer Sister 18       breast ca   Breast cancer Sister 35   Heart disease Father    Mental illness Father 39       dementia   COPD Father    Alcohol abuse Father    Cancer Maternal Aunt        breast ca   Breast cancer Maternal Aunt 45   Heart disease Maternal Uncle    Crohn's disease Maternal Uncle    Heart disease Maternal Grandmother    Cancer Maternal Aunt        breast ca   Breast cancer Maternal Aunt 60   Allergies  Allergen Reactions   Cortisone Other (See Comments)    Cortisone shot caused  uvula swelling, blisters in throat   Flecainide     Headache weakness   Current Outpatient Medications on File Prior to Visit  Medication Sig Dispense Refill   cholecalciferol (VITAMIN D3) 25 MCG (1000 UNIT) tablet Take 4,000 Units by mouth daily.     cyanocobalamin (VITAMIN B12) 1000 MCG tablet Take 1,000 mcg by mouth daily.     gabapentin (NEURONTIN) 100 MG capsule Take 300 mg by mouth in the morning, at noon, in the evening, and at bedtime.     omeprazole (PRILOSEC) 20 MG capsule TAKE 1 CAPSULE BY MOUTH EVERY DAY AS NEEDED 90 capsule 1   sucralfate (CARAFATE) 1 g tablet TAKE 1 TABLET (1 G TOTAL) BY MOUTH 4 TIMES A DAY WITH MEALS AND AT BEDTIME 120 tablet 3   No current facility-administered medications on file prior to visit.    Review of Systems  Constitutional:  Positive for fatigue. Negative for activity change, appetite change, fever and unexpected weight change.  HENT:  Negative for congestion, ear pain, rhinorrhea, sinus pressure and sore throat.   Eyes:  Negative for pain, redness and visual disturbance.  Respiratory:  Negative for cough, shortness of breath and wheezing.   Cardiovascular:  Positive for palpitations. Negative for chest pain and leg swelling.  Gastrointestinal:  Positive for  abdominal pain. Negative for blood in stool, constipation and diarrhea.  Endocrine: Negative for polydipsia and polyuria.  Genitourinary:  Negative for dysuria, frequency and urgency.  Musculoskeletal:  Negative for arthralgias, back pain and myalgias.  Skin:  Negative for pallor and rash.  Allergic/Immunologic: Negative for environmental allergies.  Neurological:  Negative for dizziness, syncope, light-headedness, numbness and headaches.       Episodic weakness / clamminess and presyncope  Hematological:  Negative for adenopathy. Does not bruise/bleed easily.  Psychiatric/Behavioral:  Negative for decreased concentration and dysphoric mood. The patient is nervous/anxious.        Objective:   Physical Exam Constitutional:      General: She is not in acute distress.    Appearance: Normal appearance. She is well-developed. She is obese. She is not ill-appearing or diaphoretic.  HENT:     Head: Normocephalic and atraumatic.  Eyes:     Conjunctiva/sclera: Conjunctivae normal.     Pupils: Pupils are equal, round, and reactive to light.  Neck:     Thyroid: No thyromegaly.     Vascular: No carotid bruit or JVD.  Cardiovascular:     Rate and Rhythm: Normal rate and regular rhythm.     Pulses: Normal pulses.     Heart sounds: Normal heart sounds.     No gallop.  Pulmonary:     Effort: Pulmonary effort is normal. No respiratory distress.     Breath sounds: Normal breath sounds. No wheezing or rales.  Abdominal:     General: There is no distension or abdominal bruit.     Palpations: Abdomen is soft.  Musculoskeletal:     Cervical back: Normal range of motion and neck supple.     Right lower leg: No edema.     Left lower leg: No edema.  Lymphadenopathy:     Cervical: No cervical adenopathy.  Skin:    General: Skin is warm and dry.     Coloration: Skin is not jaundiced or pale.     Findings: No bruising or rash.  Neurological:     Mental Status: She is alert.     Coordination:  Coordination normal.     Deep Tendon Reflexes:  Reflexes are normal and symmetric. Reflexes normal.  Psychiatric:        Attention and Perception: Attention normal.        Mood and Affect: Mood is anxious.     Comments: Candidly discusses symptoms and stressors             Assessment & Plan:   Problem List Items Addressed This Visit       Cardiovascular and Mediastinum   WPW (Wolff-Parkinson-White syndrome) - Primary    Dx in UC and hosp Reviewed hospital records, lab results and studies in detail  Intol of flecainide  Normal rhythm and rate today  Has EP follow up and procedure planned         Other   Morbid obesity (HCC)    Discussed how this problem influences overall health and the risks it imposes  Reviewed plan for weight loss with lower calorie diet (via better food choices (lower glycemic and portion control) along with exercise building up to or more than 30 minutes 5 days per week including some aerobic activity and strength training         Palpitations    These come and go  Dx with WPW and was hospitalized Reviewed hospital records, lab results and studies in detail   Wearing zio monitor with cardiology/ EP follow up  Planning invasive EP study and treatment Reassuring echo from 8/14  Did not tolerate flecainide  Has intermittent weak/clammy /dizzy spells Wears fitness watch that tracks her HR        Umbilical hernia    This was noted incidentally on CT in hospital  Unable to palpate on exam  Aware/ will follow  Pt aware of what to watch out for

## 2023-07-29 NOTE — Patient Instructions (Signed)
Take it easy  Get your EP procedure scheduled as planned  If symptoms worsen in the meantime keep [place] posted  Stay hydrated  Aim for 60 oz of fluids daily   If any severe symptoms - call 911

## 2023-07-29 NOTE — Telephone Encounter (Signed)
Pt never returned my call but has a hospital f/u today.

## 2023-07-30 NOTE — Assessment & Plan Note (Signed)
This was noted incidentally on CT in hospital  Unable to palpate on exam  Aware/ will follow  Pt aware of what to watch out for

## 2023-07-30 NOTE — Assessment & Plan Note (Signed)
Dx in UC and hosp Reviewed hospital records, lab results and studies in detail  Intol of flecainide  Normal rhythm and rate today  Has EP follow up and procedure planned

## 2023-07-30 NOTE — Assessment & Plan Note (Signed)
These come and go  Dx with WPW and was hospitalized Reviewed hospital records, lab results and studies in detail   Wearing zio monitor with cardiology/ EP follow up  Planning invasive EP study and treatment Reassuring echo from 8/14  Did not tolerate flecainide  Has intermittent weak/clammy /dizzy spells Wears fitness watch that tracks her HR

## 2023-07-30 NOTE — Assessment & Plan Note (Signed)
 Discussed how this problem influences overall health and the risks it imposes  Reviewed plan for weight loss with lower calorie diet (via better food choices (lower glycemic and portion control) along with exercise building up to or more than 30 minutes 5 days per week including some aerobic activity and strength training

## 2023-08-01 ENCOUNTER — Telehealth: Payer: Self-pay | Admitting: *Deleted

## 2023-08-01 ENCOUNTER — Encounter: Payer: Self-pay | Admitting: Family Medicine

## 2023-08-01 NOTE — Telephone Encounter (Signed)
Pt uploaded 3 EKG's through mychart that she wants PCP to review. They are under media but I have also printed them and placed them in your inbox

## 2023-08-03 NOTE — Telephone Encounter (Signed)
Done Has history of WPW

## 2023-08-09 ENCOUNTER — Telehealth: Payer: Self-pay | Admitting: Cardiovascular Disease

## 2023-08-09 NOTE — Telephone Encounter (Signed)
Patient is calling to talk with Tempie Donning. Patient only wants to see Dr. Mariah Milling and told her next available was 10/10. Offered appt with NP for 9/27 and she refused.

## 2023-08-19 NOTE — Progress Notes (Unsigned)
Cardiology Office Note  Date:  08/22/2023   ID:  SAHIRA BEX, DOB 11/30/1966, MRN [id number]  PCP:  Judy Pimple, MD   Chief Complaint  Patient presents with   Follow-up    ED follow up. DX Wolff-Parkinson-White (WPW) pattern. Patient feels well today. Medications reviewed verbally.     HPI:  Ms. Caulley  Is a 57 year old woman with history of  obesity,  palpitations /PVCs WPW diagnosis August 2024 obesity who presents for f/u of her shortness of breath, palpitations/tachycardia/PVCs, WPW  Last seen by myself in clinic October 2021 Seen by cardiology at [facility] August 2024  Covid 07/03/23  07/18/23:went to urgent care, Some tachycardia, SOB, dizzy spells EKG suggestive of WPW Was transferred to [facility], admitted o/n "confirmed WPW" Started on flecainide On second dose while in the hospital developed headache,  On reflection, she feels headache was from Flexeril not from the flecainide  Stopped flecainide (she never filled the prescription)  Seen by EP, Dr. Isabell Jarvis at [facility]:, set up for ablation 09/08/23  Labs reviewed: HBA1c : 5.6 Total chol 159, LDL 100  No exercise , limited by orthopedic issues, nerve issue  EKG personally reviewed by myself on todays visit EKG Interpretation Date/Time:  Monday August 22 2023 11:48:31 EDT Ventricular Rate:  66 PR Interval:  112 QRS Duration:  116 QT Interval:  408 QTC Calculation: 427 R Axis:   -39  Text Interpretation: Normal sinus rhythm Wolff-Parkinson-White When compared with ECG of 18-Jul-2023 13:30, No significant change was found Confirmed by Julien Nordmann [phone]) on 08/22/2023 11:54:26 AM   Other past medical hx reviewed history of palpitations dating back many years  Workup in the past included a Holter monitor, echocardiogram.  Previously given propranolol, did not try this, is afraid blood pressure would drop low    November 2016 she was going to work, got very upset with her " animals" " very upset".   She  Developed palpitations, tachycardia , had to sit down and recover.  Blood pressure was elevated , finally recovered to the point where she was able to drive to work.  At work she was not thinking clearly, continue to have high blood pressure.  Husband came and picked her up , went to the emergency room , was checked out and was told that everything looked okay   PMH:   has a past medical history of Allergy, Anemia, Complication of anesthesia, Endometriosis, GERD (gastroesophageal reflux disease), Headache, Heart palpitations, Low blood pressure, not hypotension, PONV (postoperative nausea and vomiting), and Stress incontinence.  PSH:    Past Surgical History:  Procedure Laterality Date   ABDOMINAL HYSTERECTOMY     BREAST EXCISIONAL BIOPSY Right    neg    MRI  05/12/2000   brain was neg   PLANTAR FASCIA SURGERY Left    SHOULDER ARTHROSCOPY WITH ROTATOR CUFF REPAIR Right 04/03/2018   Procedure: Right SHOULDER ARTHROSCOPY WITH ROTATOR CUFF REPAIR, distal clavicle excision, decompression and biceps tenotomy;  Surgeon: Lyndle Herrlich, MD;  Location: [facility] ORS;  Service: Orthopedics;  Laterality: Right;   TONSILLECTOMY     age 11    Current Outpatient Medications  Medication Sig Dispense Refill   cholecalciferol (VITAMIN D3) 25 MCG (1000 UNIT) tablet Take 4,000 Units by mouth daily.     cyanocobalamin (VITAMIN B12) 1000 MCG tablet Take 1,000 mcg by mouth daily.     gabapentin (NEURONTIN) 100 MG capsule Take 300 mg by mouth in the morning, at noon, in the evening,  and at bedtime.     omeprazole (PRILOSEC) 20 MG capsule TAKE 1 CAPSULE BY MOUTH EVERY DAY AS NEEDED 90 capsule 1   No current facility-administered medications for this visit.    Allergies:   Cortisone and Flecainide   Social History:  The patient  reports that she has never smoked. She has never used smokeless tobacco. She reports that she does not drink alcohol and does not use drugs.   Family History:   family history  includes Alcohol abuse in her father; Breast cancer (age of onset: 62) in her sister; Breast cancer (age of onset: 55) in her maternal aunt; Breast cancer (age of onset: 57) in her maternal aunt; COPD in her father and mother; Cancer in her maternal aunt and maternal aunt; Cancer (age of onset: 78) in her sister; Crohn's disease in her maternal uncle; Heart disease in her father, maternal grandmother, and maternal uncle; Irritable bowel syndrome in her mother; Mental illness (age of onset: 48) in her father; Pulmonary embolism in her mother.   Review of Systems: Review of Systems  Constitutional: Negative.   Respiratory: Negative.    Cardiovascular:  Positive for palpitations.  Gastrointestinal: Negative.   Musculoskeletal: Negative.   Neurological: Negative.   Psychiatric/Behavioral: Negative.    All other systems reviewed and are negative.   PHYSICAL EXAM: VS:  BP 112/73 (BP Location: Left Arm, Patient Position: Sitting, Cuff Size: Normal)   Pulse 66   Ht 5\' 4"  (1.626 m)   Wt 237 lb 9.6 oz (107.8 kg)   SpO2 96%   BMI 40.78 kg/m  , BMI Body mass index is 40.78 kg/m. Constitutional:  oriented to person, place, and time. No distress.  HENT:  Head: Grossly normal Eyes:  no discharge. No scleral icterus.  Neck: No JVD, no carotid bruits  Cardiovascular: Regular rate and rhythm, no murmurs appreciated Pulmonary/Chest: Clear to auscultation bilaterally, no wheezes or rails Abdominal: Soft.  no distension.  no tenderness.  Musculoskeletal: Normal range of motion Neurological:  normal muscle tone. Coordination normal. No atrophy Skin: Skin warm and dry Psychiatric: normal affect, pleasant  Recent Labs: 06/17/2023: ALT 22; BUN 11; Creatinine, Ser 0.74; Hemoglobin 13.0; Platelets 308.0; Potassium 4.4; Sodium 141; TSH 0.75   Lipid Panel Lab Results  Component Value Date   CHOL 159 06/17/2023   HDL 44.60 06/17/2023   LDLCALC 100 (H) 06/17/2023   TRIG 73.0 06/17/2023      Wt  Readings from Last 3 Encounters:  08/22/23 237 lb 9.6 oz (107.8 kg)  07/29/23 237 lb 6 oz (107.7 kg)  07/18/23 225 lb (102.1 kg)     ASSESSMENT AND PLAN:  WPW New finding on EKG August 2024 Symptoms of paroxysmal tachycardia, SOB, dizziness/diaphoresis Scheduled for ablation 09/08/23  Morbid obesity (HCC) We have encouraged continued exercise, careful diet management in an effort to lose weight.  Headaches Episodic,  Takes IBP 3-4   Total encounter time more than 30 minutes  Greater than 50% was spent in counseling and coordination of care with the patient    Orders Placed This Encounter  Procedures   EKG 12-Lead     Signed, Dossie Arbour, M.D., Ph.D. 08/22/2023  [facility] [place], [place] [phone]

## 2023-08-22 ENCOUNTER — Ambulatory Visit: Payer: BC Managed Care – PPO | Attending: Cardiovascular Disease | Admitting: Cardiovascular Disease

## 2023-08-22 ENCOUNTER — Encounter: Payer: Self-pay | Admitting: Cardiovascular Disease

## 2023-08-22 VITALS — BP 112/73 | HR 66 | Ht 64.0 in | Wt 237.6 lb

## 2023-08-22 DIAGNOSIS — I493 Ventricular premature depolarization: Secondary | ICD-10-CM

## 2023-08-22 DIAGNOSIS — I456 Pre-excitation syndrome: Secondary | ICD-10-CM

## 2023-08-22 DIAGNOSIS — E785 Hyperlipidemia, unspecified: Secondary | ICD-10-CM

## 2023-08-22 DIAGNOSIS — R002 Palpitations: Secondary | ICD-10-CM | POA: Diagnosis not present

## 2023-08-22 NOTE — Patient Instructions (Addendum)
In setting of WPW, Avoid: tobacco, smoking, caffeine, alcohol, pseudo-ephedrine (a nasal decongestant) and similar antihistamines and amphetamines    Medication Instructions:  No changes  If you need a refill on your cardiac medications before your next appointment, please call your pharmacy.   Lab work: No new labs needed  Testing/Procedures: No new testing needed  Follow-Up: At [facility], you and your health needs are our priority.  As part of our continuing mission to provide you with exceptional heart care, we have created designated Provider Care Teams.  These Care Teams include your primary Cardiologist (physician) and Advanced Practice Providers (APPs -  Physician Assistants and Nurse Practitioners) who all work together to provide you with the care you need, when you need it.  You will need a follow up appointment in 12 months  Providers on your designated Care Team:   Nicolasa Ducking, NP Eula Listen, PA-C Cadence Fransico Michael, [place]  COVID-19 Vaccine Information can be found at: [email or web address] For questions related to vaccine distribution or appointments, please email [email or web address] or call [phone].

## 2023-08-31 ENCOUNTER — Ambulatory Visit
Admission: RE | Admit: 2023-08-31 | Discharge: 2023-08-31 | Disposition: A | Discharge status: Home / Self Care | Payer: BC Managed Care – PPO | Source: Ambulatory Visit | Attending: Family Medicine | Admitting: Family Medicine

## 2023-08-31 DIAGNOSIS — Z1231 Encounter for screening mammogram for malignant neoplasm of breast: Secondary | ICD-10-CM | POA: Insufficient documentation

## 2023-09-19 ENCOUNTER — Ambulatory Visit: Payer: BC Managed Care – PPO | Admitting: Family Medicine

## 2023-09-19 ENCOUNTER — Encounter: Payer: Self-pay | Admitting: Family Medicine

## 2023-09-19 VITALS — BP 118/80 | HR 58 | Temp 97.7°F | Ht 64.0 in | Wt 240.0 lb

## 2023-09-19 DIAGNOSIS — I456 Pre-excitation syndrome: Secondary | ICD-10-CM | POA: Diagnosis not present

## 2023-09-19 DIAGNOSIS — R0683 Snoring: Secondary | ICD-10-CM | POA: Diagnosis not present

## 2023-09-19 DIAGNOSIS — R400 Somnolence: Secondary | ICD-10-CM | POA: Insufficient documentation

## 2023-09-19 NOTE — Assessment & Plan Note (Signed)
Pt had ablation and will have to have another one likely next month (accessory pathway was deeper than expected) On flecainide and metoprolol-tolerating fairly   Blood pressure and pulse are stable

## 2023-09-19 NOTE — Progress Notes (Signed)
Subjective:    Patient ID: Briana Burnett, female    DOB: 17-Feb-1966, 57 y.o.   MRN: [id number]  HPI  Wt Readings from Last 3 Encounters:  09/19/23 240 lb (108.9 kg)  08/22/23 237 lb 9.6 oz (107.8 kg)  07/29/23 237 lb 6 oz (107.7 kg)   41.20 kg/m  Vitals:   09/19/23 1056  BP: 118/80  Pulse: (!) 58  Temp: 97.7 F (36.5 C)  SpO2: 99%     Pt presents to discuss sleep concerns   Had cardiac ablation for WPW rhythm recently = was more complicated than anticipated Will have to have another procedure to freeze/ablate another area   Laying on the table got her meralgia paresthetica worse  Improving now    Currently taking flecainide 50 mg bid  Metoprolol xl 25 mg daily   BP Readings from Last 3 Encounters:  09/19/23 118/80  08/22/23 112/73  07/29/23 130/70   Pulse Readings from Last 3 Encounters:  09/19/23 (!) 58  08/22/23 66  07/29/23 88   Meds do fatigue her a bit   Sleep concern Noted during her procedure= cardiology noted features of sleep apnea  Wanted her to follow up   Is afraid of cpap   Sleepy all the time - does nod off easily during the dya  Does snore  She does wake up with headaches   Does occational wake up with palpitations      Patient Active Problem List   Diagnosis Date Noted   Snoring 09/19/2023   Somnolence 09/19/2023   WPW (Wolff-Parkinson-White syndrome) 07/29/2023   Umbilical hernia 07/29/2023   Vitamin D deficiency 06/24/2023   Current use of proton pump inhibitor 04/23/2022   GERD (gastroesophageal reflux disease) 04/07/2022   Right flank pain 04/07/2022   Elevated MCV 02/19/2020   Mild hyperlipidemia 02/19/2020   Elevated glucose level 02/11/2020   Encounter for screening mammogram for breast cancer 01/22/2019   Chronic right shoulder pain 01/22/2019   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   Epigastric pain 08/22/2017   Mood change 05/21/2015   Morbid obesity (HCC) 05/21/2015   Nocturia 07/24/2013   Allergic rhinitis  03/13/2012   Routine general medical examination at a health care facility 11/25/2011   New daily persistent headache 12/18/2009   FATIGUE 02/04/2009   Palpitations 08/16/2007   Past Medical History:  Diagnosis Date   Allergy    Anemia    h/o   Complication of anesthesia    Endometriosis    GERD (gastroesophageal reflux disease)    Headache    Heart palpitations    has seen dr Mariah Milling   Low blood pressure, not hypotension    PONV (postoperative nausea and vomiting)    nasuea   Stress incontinence    Past Surgical History:  Procedure Laterality Date   ABDOMINAL HYSTERECTOMY     BREAST EXCISIONAL BIOPSY Right    neg    MRI  05/12/2000   brain was neg   PLANTAR FASCIA SURGERY Left    SHOULDER ARTHROSCOPY WITH ROTATOR CUFF REPAIR Right 04/03/2018   Procedure: Right SHOULDER ARTHROSCOPY WITH ROTATOR CUFF REPAIR, distal clavicle excision, decompression and biceps tenotomy;  Surgeon: Lyndle Herrlich, MD;  Location: [facility];  Service: Orthopedics;  Laterality: Right;   TONSILLECTOMY     age 28   Social History   Tobacco Use   Smoking status: Never   Smokeless tobacco: Never  Vaping Use   Vaping status: Never Used  Substance Use Topics   Alcohol  use: No    Alcohol/week: 0.0 standard drinks of alcohol   Drug use: No   Family History  Problem Relation Age of Onset   COPD Mother    Pulmonary embolism Mother    Irritable bowel syndrome Mother    Cancer Sister 2       breast ca   Breast cancer Sister 52   Heart disease Father    Mental illness Father 52       dementia   COPD Father    Alcohol abuse Father    Cancer Maternal Aunt        breast ca   Breast cancer Maternal Aunt 45   Heart disease Maternal Uncle    Crohn's disease Maternal Uncle    Heart disease Maternal Grandmother    Cancer Maternal Aunt        breast ca   Breast cancer Maternal Aunt 60   Allergies  Allergen Reactions   Cortisone Other (See Comments)    Cortisone shot caused uvula swelling,  blisters in throat   Current Outpatient Medications on File Prior to Visit  Medication Sig Dispense Refill   cholecalciferol (VITAMIN D3) 25 MCG (1000 UNIT) tablet Take 4,000 Units by mouth daily.     cyanocobalamin (VITAMIN B12) 1000 MCG tablet Take 1,000 mcg by mouth daily.     flecainide (TAMBOCOR) 50 MG tablet Take 50 mg by mouth 2 (two) times daily.     gabapentin (NEURONTIN) 100 MG capsule Take 300 mg by mouth in the morning, at noon, in the evening, and at bedtime.     metoprolol succinate (TOPROL-XL) 25 MG 24 hr tablet Take 25 mg by mouth daily. Take 2 tabs PO daily     omeprazole (PRILOSEC) 20 MG capsule TAKE 1 CAPSULE BY MOUTH EVERY DAY AS NEEDED 90 capsule 1   No current facility-administered medications on file prior to visit.    Review of Systems  Constitutional:  Positive for fatigue. Negative for activity change, appetite change, fever and unexpected weight change.  HENT:  Negative for congestion, ear pain, rhinorrhea, sinus pressure and sore throat.   Eyes:  Negative for pain, redness and visual disturbance.  Respiratory:  Negative for cough, shortness of breath and wheezing.   Cardiovascular:  Positive for palpitations. Negative for chest pain and leg swelling.  Gastrointestinal:  Negative for abdominal pain, blood in stool, constipation and diarrhea.  Endocrine: Negative for polydipsia and polyuria.  Genitourinary:  Negative for dysuria, frequency and urgency.  Musculoskeletal:  Positive for arthralgias and back pain. Negative for myalgias.  Skin:  Negative for pallor and rash.  Allergic/Immunologic: Negative for environmental allergies.  Neurological:  Positive for numbness. Negative for dizziness, seizures, syncope and headaches.  Hematological:  Negative for adenopathy. Does not bruise/bleed easily.  Psychiatric/Behavioral:  Negative for decreased concentration and dysphoric mood. The patient is not nervous/anxious.        Objective:   Physical  Exam Constitutional:      General: She is not in acute distress.    Appearance: Normal appearance. She is well-developed. She is obese. She is not ill-appearing or diaphoretic.  HENT:     Head: Normocephalic and atraumatic.     Mouth/Throat:     Mouth: Mucous membranes are moist.  Eyes:     General: No scleral icterus.    Conjunctiva/sclera: Conjunctivae normal.     Pupils: Pupils are equal, round, and reactive to light.  Neck:     Thyroid: No thyromegaly.  Vascular: No carotid bruit or JVD.  Cardiovascular:     Rate and Rhythm: Regular rhythm. Bradycardia present.     Heart sounds: Normal heart sounds.     No gallop.  Pulmonary:     Effort: Pulmonary effort is normal. No respiratory distress.     Breath sounds: Normal breath sounds. No wheezing or rales.  Abdominal:     General: There is no distension or abdominal bruit.     Palpations: Abdomen is soft.  Musculoskeletal:     Cervical back: Normal range of motion and neck supple.     Right lower leg: No edema.     Left lower leg: No edema.  Lymphadenopathy:     Cervical: No cervical adenopathy.  Skin:    General: Skin is warm and dry.     Coloration: Skin is not pale.     Findings: No rash.  Neurological:     Mental Status: She is alert.     Coordination: Coordination normal.     Deep Tendon Reflexes: Reflexes are normal and symmetric. Reflexes normal.  Psychiatric:        Mood and Affect: Mood normal.           Assessment & Plan:   Problem List Items Addressed This Visit       Cardiovascular and Mediastinum   WPW (Wolff-Parkinson-White syndrome)    Pt had ablation and will have to have another one likely next month (accessory pathway was deeper than expected) On flecainide and metoprolol-tolerating fairly   Blood pressure and pulse are stable       Relevant Medications   flecainide (TAMBOCOR) 50 MG tablet   metoprolol succinate (TOPROL-XL) 25 MG 24 hr tablet     Other   Snoring    Worsening with  time and weight gain   Cardiologist noted signs of OSA during procedure Ref to pulm to discuss this         Relevant Orders   Ambulatory referral to Pulmonology   Somnolence - Primary    Snoring, day time somnolence and unrestorative sleep in setting of obesity  Witnessed apnea during cardiology procedure as well Strongly suspect OSA Discussed risks of this incl cardiac, stroke, and falling asleep while working or driving   This may make it harder to loose weight (she is trying)  Referral made to pulmonary to review this and consider sleep study and treatment options        Relevant Orders   Ambulatory referral to Pulmonology

## 2023-09-19 NOTE — Patient Instructions (Signed)
I put the referral in for pulmonary to discuss sleep apnea  Please let us know if you don't hear in 1-2 weeks   Take care of you  Try not to sleep on your back

## 2023-09-19 NOTE — Assessment & Plan Note (Signed)
Snoring, day time somnolence and unrestorative sleep in setting of obesity  Witnessed apnea during cardiology procedure as well Strongly suspect OSA Discussed risks of this incl cardiac, stroke, and falling asleep while working or driving   This may make it harder to loose weight (she is trying)  Referral made to pulmonary to review this and consider sleep study and treatment options

## 2023-09-19 NOTE — Assessment & Plan Note (Signed)
Worsening with time and weight gain   Cardiologist noted signs of OSA during procedure Ref to pulm to discuss this

## 2023-09-23 ENCOUNTER — Encounter: Payer: Self-pay | Admitting: Adult Health

## 2023-09-23 ENCOUNTER — Ambulatory Visit (INDEPENDENT_AMBULATORY_CARE_PROVIDER_SITE_OTHER): Payer: BC Managed Care – PPO | Admitting: Adult Health

## 2023-09-23 VITALS — BP 118/78 | HR 58 | Temp 97.7°F | Ht 64.0 in | Wt 239.6 lb

## 2023-09-23 DIAGNOSIS — R0683 Snoring: Secondary | ICD-10-CM

## 2023-09-23 NOTE — Progress Notes (Signed)
@Patient  ID: Briana Burnett, female    DOB: [birth year], 57 y.o.   MRN: [id number]  Chief Complaint  Patient presents with   Consult    Referring provider: Judy Pimple, MD  HPI: 57 year old female seen for sleep consult September 23, 2023 for loud snoring, restless sleep, gasping for air at nighttime, daytime sleepiness Medical history significant for Wolff-Parkinson-White syndrome  TEST/EVENTS :   09/23/2023 Sleep consult  Patient presents for sleep consult today for snoring, restless sleep, for gasping for air at nighttime and daytime sleepiness.  Kindly referred by primary care provider Tower, Audrie Gallus, MD Patient complains of mild snoring, restless sleep, sleep is very fragmented, daytime sleepiness.  She also has frequent headaches.  She also has Wolff-Parkinson-White syndrome.  Typically goes to bed about 9 to 11 PM.  Goes to sleep very quickly.  Is up multiple times throughout the night.  Wakes up frequently.  Gets up about 7 AM.  Weight is up about 20 pounds over the last 2 years.  Current weight is 239 pounds with a BMI at 41.  Patient has never had a sleep study before.  Patient does not nap.  Does not take any sleep aids.  No history of congestive heart failure or stroke.  No symptoms suspicious for cataplexy or sleep paralysis .  No removable dental work. She is followed by cardiology found to have Wolff-Parkinson-White pattern..Underwent heart Ablation 09/08/23.  She is on flecainide and Metoprolol.    Epworth  score is 9 out of 24.  See below     09/23/2023   10:00 AM  Results of the Epworth flowsheet  Sitting and reading 3  Watching TV 2  Sitting, inactive in a public place (e.g. a theatre or a meeting) 1  As a passenger in a car for an hour without a break 0  Lying down to rest in the afternoon when circumstances permit 3  Sitting and talking to someone 0  Sitting quietly after a lunch without alcohol 0  In a car, while stopped for a few minutes in traffic 0   Total score 9   Social history.  Patient is married.  Lives at home with her husband.  She is disabled.  Has children.  She is a never smoker.  No alcohol or drug use.  Family history positive for breast cancer.  Past Surgical History:  Procedure Laterality Date   ABDOMINAL HYSTERECTOMY     BREAST EXCISIONAL BIOPSY Right    neg    MRI  05/12/2000   brain was neg   PLANTAR FASCIA SURGERY Left    SHOULDER ARTHROSCOPY WITH ROTATOR CUFF REPAIR Right 04/03/2018   Procedure: Right SHOULDER ARTHROSCOPY WITH ROTATOR CUFF REPAIR, distal clavicle excision, decompression and biceps tenotomy;  Surgeon: Lyndle Herrlich, MD;  Location: [facility];  Service: Orthopedics;  Laterality: Right;   TONSILLECTOMY     age 7     Allergies  Allergen Reactions   Cortisone Other (See Comments)    Cortisone shot caused uvula swelling, blisters in throat    Immunization History  Administered Date(s) Administered   Influenza Whole 12/18/2009   Influenza,inj,Quad PF,6+ Mos 01/20/2016, 01/22/2019   Td 06/24/2023   Tdap 03/13/2012    Past Medical History:  Diagnosis Date   Allergy    Anemia    h/o   Complication of anesthesia    Endometriosis    GERD (gastroesophageal reflux disease)    Headache    Heart palpitations  has seen dr Mariah Milling   Low blood pressure, not hypotension    PONV (postoperative nausea and vomiting)    nasuea   Stress incontinence     Tobacco History: Social History   Tobacco Use  Smoking Status Never  Smokeless Tobacco Never   Counseling given: Not Answered   Outpatient Medications Prior to Visit  Medication Sig Dispense Refill   cholecalciferol (VITAMIN D3) 25 MCG (1000 UNIT) tablet Take 4,000 Units by mouth daily.     cyanocobalamin (VITAMIN B12) 1000 MCG tablet Take 1,000 mcg by mouth daily.     flecainide (TAMBOCOR) 50 MG tablet Take 50 mg by mouth 2 (two) times daily.     metoprolol succinate (TOPROL-XL) 25 MG 24 hr tablet Take 25 mg by mouth daily. Take 2  tabs PO daily     omeprazole (PRILOSEC) 20 MG capsule TAKE 1 CAPSULE BY MOUTH EVERY DAY AS NEEDED 90 capsule 1   gabapentin (NEURONTIN) 100 MG capsule Take 300 mg by mouth in the morning, at noon, in the evening, and at bedtime. (Patient not taking: Reported on 09/23/2023)     No facility-administered medications prior to visit.     Review of Systems:   Constitutional:   No  weight loss, night sweats,  Fevers, chills, +fatigue, or  lassitude.  HEENT:   No headaches,  Difficulty swallowing,  Tooth/dental problems, or  Sore throat,                No sneezing, itching, ear ache, nasal congestion, post nasal drip,   CV:  No chest pain,  Orthopnea, PND, swelling in lower extremities, anasarca, dizziness, palpitations, syncope.   GI  No heartburn, indigestion, abdominal pain, nausea, vomiting, diarrhea, change in bowel habits, loss of appetite, bloody stools.   Resp: No shortness of breath with exertion or at rest.  No excess mucus, no productive cough,  No non-productive cough,  No coughing up of blood.  No change in color of mucus.  No wheezing.  No chest wall deformity  Skin: no rash or lesions.  GU: no dysuria, change in color of urine, no urgency or frequency.  No flank pain, no hematuria   MS:  No joint pain or swelling.  No decreased range of motion.  No back pain.    Physical Exam  BP 118/78 (BP Location: Right Arm, Cuff Size: Normal)   Pulse (!) 58   Temp 97.7 F (36.5 C)   Ht 5\' 4"  (1.626 m)   Wt 239 lb 9.6 oz (108.7 kg)   SpO2 100%   BMI 41.13 kg/m   GEN: A/Ox3; pleasant , NAD, well nourished    HEENT:  [place]/AT,   NOSE-clear, THROAT-clear, no lesions, no postnasal drip or exudate noted.  Class 2 MP airway   NECK:  Supple w/ fair ROM; no JVD; normal carotid impulses w/o bruits; no thyromegaly or nodules palpated; no lymphadenopathy.    RESP  Clear  P & A; w/o, wheezes/ rales/ or rhonchi. no accessory muscle use, no dullness to percussion  CARD:  RRR, no m/r/g, no  peripheral edema, pulses intact, no cyanosis or clubbing.  GI:   Soft & nt; nml bowel sounds; no organomegaly or masses detected.   Musco: Warm bil, no deformities or joint swelling noted.   Neuro: alert, no focal deficits noted.    Skin: Warm, no lesions or rashes    Lab Results:  CBC    Component Value Date/Time   WBC 6.4 06/17/2023 0834  RBC 3.88 06/17/2023 0834   HGB 13.0 06/17/2023 0834   HCT 39.9 06/17/2023 0834   PLT 308.0 06/17/2023 0834   MCV 102.7 (H) 06/17/2023 0834   MCH 34.1 (H) 04/23/2022 1443   MCHC 32.6 06/17/2023 0834   RDW 14.0 06/17/2023 0834   LYMPHSABS 1.2 06/17/2023 0834   MONOABS 0.6 06/17/2023 0834   EOSABS 0.1 06/17/2023 0834   BASOSABS 0.0 06/17/2023 0834    BMET    Component Value Date/Time   NA 141 06/17/2023 0834   K 4.4 06/17/2023 0834   CL 102 06/17/2023 0834   CO2 34 (H) 06/17/2023 0834   GLUCOSE 96 06/17/2023 0834   BUN 11 06/17/2023 0834   CREATININE 0.74 06/17/2023 0834   CREATININE 0.66 04/23/2022 1443   CALCIUM 9.5 06/17/2023 0834   GFRNONAA >60 07/23/2021 1239   GFRAA >60 11/04/2015 1009    BNP No results found for: "BNP"  ProBNP No results found for: "PROBNP"  Imaging:   Administration History     None           No data to display          No results found for: "NITRICOXIDE"      Assessment & Plan:   Snoring Loud snoring, restless sleep, fragmented sleep, daytime sleepiness, history of cardiac arrhythmia all concerning for underlying sleep apnea.  Will set patient up for home sleep study.  Patient education given  - discussed how weight can impact sleep and risk for sleep disordered breathing - discussed options to assist with weight loss: combination of diet modification, cardiovascular and strength training exercises   - had an extensive discussion regarding the adverse health consequences related to untreated sleep disordered breathing - specifically discussed the risks for hypertension,  coronary artery disease, cardiac dysrhythmias, cerebrovascular disease, and diabetes - lifestyle modification discussed   - discussed how sleep disruption can increase risk of accidents, particularly when driving - safe driving practices were discussed    Plan  . Patient Instructions  Set up for home sleep study Healthy sleep regimen Do not drive if sleepy Work on healthy weight loss Follow-up in 6 weeks to discuss sleep study results and treatment plan-can be seen on Friday afternoon virtual clinic      Rubye Oaks, NP 09/23/2023

## 2023-09-23 NOTE — Assessment & Plan Note (Signed)
Loud snoring, restless sleep, fragmented sleep, daytime sleepiness, history of cardiac arrhythmia all concerning for underlying sleep apnea.  Will set patient up for home sleep study.  Patient education given  - discussed how weight can impact sleep and risk for sleep disordered breathing - discussed options to assist with weight loss: combination of diet modification, cardiovascular and strength training exercises   - had an extensive discussion regarding the adverse health consequences related to untreated sleep disordered breathing - specifically discussed the risks for hypertension, coronary artery disease, cardiac dysrhythmias, cerebrovascular disease, and diabetes - lifestyle modification discussed   - discussed how sleep disruption can increase risk of accidents, particularly when driving - safe driving practices were discussed    Plan  . Patient Instructions  Set up for home sleep study Healthy sleep regimen Do not drive if sleepy Work on healthy weight loss Follow-up in 6 weeks to discuss sleep study results and treatment plan-can be seen on Friday afternoon virtual clinic

## 2023-09-23 NOTE — Patient Instructions (Signed)
Set up for home sleep study Healthy sleep regimen Do not drive if sleepy Work on healthy weight loss Follow-up in 6 weeks to discuss sleep study results and treatment plan-can be seen on Friday afternoon virtual clinic

## 2023-09-23 NOTE — Addendum Note (Signed)
Addended by: Bonney Leitz on: 09/23/2023 10:49 AM   Modules accepted: Orders

## 2023-09-30 DIAGNOSIS — R0683 Snoring: Secondary | ICD-10-CM

## 2023-10-23 ENCOUNTER — Telehealth: Payer: Self-pay | Admitting: Pulmonary Disease

## 2023-10-23 NOTE — Telephone Encounter (Signed)
Call patient  Sleep study result  Date of study: 09/30/2023  Impression: Moderate obstructive sleep apnea with moderate oxygen desaturations  Recommendation: DME referral  Recommend CPAP therapy for moderate obstructive sleep apnea  Auto titrating CPAP with pressure settings of 5-15 will be appropriate  Encourage weight loss measures  Follow-up in the office 4 to 6 weeks following initiation of treatment

## 2023-11-11 ENCOUNTER — Telehealth: Payer: BC Managed Care – PPO | Admitting: Adult Health

## 2023-11-11 ENCOUNTER — Encounter: Payer: Self-pay | Admitting: Adult Health

## 2023-11-11 DIAGNOSIS — G4733 Obstructive sleep apnea (adult) (pediatric): Secondary | ICD-10-CM

## 2023-11-11 NOTE — Patient Instructions (Addendum)
Begin CPAP at bedtime goal is to wear all night long for at least 6hr or more  Dreamwear Nasal mask.  Work on healthy weight loss Do not drive if sleepy Follow-up in 3 months and as needed

## 2023-11-11 NOTE — Progress Notes (Signed)
Virtual Visit via Video Note  I connected with Briana Burnett on 11/11/23 at  2:30 PM EST by a video enabled telemedicine application and verified that I am speaking with the correct person using two identifiers.  Location: Patient: Home  Provider: Office    I discussed the limitations of evaluation and management by telemedicine and the availability of in person appointments. The patient expressed understanding and agreed to proceed.  History of Present Illness: 57 year old female seen for sleep consult September 23, 2023 for snoring, daytime sleepiness found to have moderate obstructive sleep apnea  Today's video visit is a 6-week follow-up.  Patient was seen last visit for sleep consult with snoring, restless sleep, gasping for air at nighttime and daytime sleepiness.  She has a history of Wolff-Parkinson-White syndrome.  She was set up for a home sleep study that was done on September 30, 2023 that showed moderate sleep apnea with AHI at 16.4/hour and SpO2 low at 78%.  We discussed her sleep study results in detail went over treatment options including weight loss and CPAP therapy.  Patient is in agreement to begin CPAP.  Past Medical History:  Diagnosis Date   Allergy    Anemia    h/o   Complication of anesthesia    Endometriosis    GERD (gastroesophageal reflux disease)    Headache    Heart palpitations    has seen dr Mariah Milling   Low blood pressure, not hypotension    PONV (postoperative nausea and vomiting)    nasuea   Stress incontinence    Current Outpatient Medications on File Prior to Visit  Medication Sig Dispense Refill   cholecalciferol (VITAMIN D3) 25 MCG (1000 UNIT) tablet Take 4,000 Units by mouth daily.     cyanocobalamin (VITAMIN B12) 1000 MCG tablet Take 1,000 mcg by mouth daily.     flecainide (TAMBOCOR) 50 MG tablet Take 50 mg by mouth 2 (two) times daily.     gabapentin (NEURONTIN) 100 MG capsule Take 300 mg by mouth in the morning, at noon, in the evening, and  at bedtime. (Patient not taking: Reported on 09/23/2023)     metoprolol succinate (TOPROL-XL) 25 MG 24 hr tablet Take 25 mg by mouth daily. Take 2 tabs PO daily     omeprazole (PRILOSEC) 20 MG capsule TAKE 1 CAPSULE BY MOUTH EVERY DAY AS NEEDED 90 capsule 1   No current facility-administered medications on file prior to visit.      Observations/Objective:  Appears well in no acute distress  Assessment and Plan:  Moderate obstructive sleep apnea-patient education given on sleep apnea.  With patient's symptom burden and medical history needs to begin CPAP therapy.-Begin AutoSet 5 to 15 cm H.  Will use a DreamWear nasal mask  - discussed how weight can impact sleep and risk for sleep disordered breathing - discussed options to assist with weight loss: combination of diet modification, cardiovascular and strength training exercises   - had an extensive discussion regarding the adverse health consequences related to untreated sleep disordered breathing - specifically discussed the risks for hypertension, coronary artery disease, cardiac dysrhythmias, cerebrovascular disease, and diabetes - lifestyle modification discussed   - discussed how sleep disruption can increase risk of accidents, particularly when driving - safe driving practices were discussed   Hx of WPW -keep follow-up with cardiology   plan   Plan Patient Instructions  Begin CPAP at bedtime goal is to wear all night long for at least 6hr or more  Dreamwear Nasal  mask.  Work on healthy weight loss Do not drive if sleepy Follow-up in 3 months and as needed   Follow Up Instructions:    I discussed the assessment and treatment plan with the patient. The patient was provided an opportunity to ask questions and all were answered. The patient agreed with the plan and demonstrated an understanding of the area and put the order in instructions.   The patient was advised to call back or seek an in-person evaluation if the  symptoms worsen or if the condition fails to improve as anticipated.  I provided 22 minutes of non-face-to-face time during this encounter.   Rubye Oaks, NP

## 2023-11-20 ENCOUNTER — Ambulatory Visit: Payer: BC Managed Care – PPO

## 2023-12-06 ENCOUNTER — Other Ambulatory Visit: Payer: Self-pay | Admitting: Family Medicine

## 2024-01-29 NOTE — Telephone Encounter (Signed)
 Patient has had a visit since sleep study was done.

## 2024-05-09 ENCOUNTER — Ambulatory Visit: Payer: Self-pay

## 2024-05-09 NOTE — Telephone Encounter (Signed)
  FYI Only or Action Required?: FYI only for provider  Patient was last seen in primary care on 09/19/2023 by Clemens Curt, MD. Called Nurse Triage reporting Hand Pain. Symptoms began yesterday. Interventions attempted: Nothing. Symptoms are: stable.  Triage Disposition: See PCP When Office is Open (Within 3 Days) Patient wants to be seen today, so she will go to [facility]  Patient/caregiver understands and will follow disposition?: Yes          Copied from CRM [id number]. Topic: Clinical - Red Word Triage >> May 09, 2024  1:45 PM Briana Burnett wrote: Red Word that prompted transfer to Nurse Triage: Vein swollen near thumb area Reason for Disposition  [1] MODERATE pain (e.g., interferes with normal activities) AND [2] present > 3 days  Answer Assessment - Initial Assessment Questions 1. ONSET: "When did the pain start?"     yesterday 2. LOCATION: "Where is the pain located?"     Left palmar surface below thumb 3. PAIN: "How bad is the pain?" (Scale 1-10; or mild, moderate, severe)   - MILD (1-3): doesn't interfere with normal activities   - MODERATE (4-7): interferes with normal activities (e.g., work or school) or awakens from sleep   - SEVERE (8-10): excruciating pain, unable to use hand at all     Throbbing 5/10 4. WORK OR EXERCISE: "Has there been any recent work or exercise that involved this part (i.e., hand or wrist) of the body?"     no 5. CAUSE: "What do you think is causing the pain?"     No/unknown 6. AGGRAVATING FACTORS: "What makes the pain worse?" (e.g., using computer)     Hurts to make a fist 7. OTHER SYMPTOMS: "Do you have any other symptoms?" (e.g., neck pain, swelling, rash, numbness, fever)     Vein is swollen, area is warm and throbbing   Patient describes vein swelling on palmar surface at base of thumb with throbbing pain. Denies injury, does not know cause of pain  Protocols used: Hand and Wrist Pain-A-AH

## 2024-05-09 NOTE — Telephone Encounter (Signed)
 Aware, will watch for correspondence

## 2024-05-09 NOTE — Telephone Encounter (Signed)
 Noted

## 2024-06-29 ENCOUNTER — Encounter: Payer: Self-pay | Admitting: Family Medicine

## 2024-06-29 ENCOUNTER — Ambulatory Visit: Payer: Self-pay | Admitting: Family Medicine

## 2024-06-29 ENCOUNTER — Ambulatory Visit (INDEPENDENT_AMBULATORY_CARE_PROVIDER_SITE_OTHER): Payer: Self-pay | Admitting: Family Medicine

## 2024-06-29 VITALS — BP 116/72 | HR 52 | Temp 98.1°F | Ht 63.5 in | Wt 228.2 lb

## 2024-06-29 DIAGNOSIS — K219 Gastro-esophageal reflux disease without esophagitis: Secondary | ICD-10-CM | POA: Diagnosis not present

## 2024-06-29 DIAGNOSIS — R7309 Other abnormal glucose: Secondary | ICD-10-CM | POA: Diagnosis not present

## 2024-06-29 DIAGNOSIS — Z79899 Other long term (current) drug therapy: Secondary | ICD-10-CM

## 2024-06-29 DIAGNOSIS — R002 Palpitations: Secondary | ICD-10-CM

## 2024-06-29 DIAGNOSIS — E785 Hyperlipidemia, unspecified: Secondary | ICD-10-CM | POA: Diagnosis not present

## 2024-06-29 DIAGNOSIS — E559 Vitamin D deficiency, unspecified: Secondary | ICD-10-CM | POA: Diagnosis not present

## 2024-06-29 DIAGNOSIS — R5382 Chronic fatigue, unspecified: Secondary | ICD-10-CM | POA: Diagnosis not present

## 2024-06-29 DIAGNOSIS — Z1231 Encounter for screening mammogram for malignant neoplasm of breast: Secondary | ICD-10-CM

## 2024-06-29 DIAGNOSIS — I456 Pre-excitation syndrome: Secondary | ICD-10-CM

## 2024-06-29 LAB — LIPID PANEL
Cholesterol: 167 mg/dL (ref 0–200)
HDL: 46.6 mg/dL (ref >39.00)
LDL Cholesterol: 108 mg/dL — ABNORMAL HIGH (ref 0–99)
NonHDL: 120.26
Total CHOL/HDL Ratio: 4
Triglycerides: 59 mg/dL (ref 0.0–149.0)
VLDL: 11.8 mg/dL (ref 0.0–40.0)

## 2024-06-29 LAB — COMPREHENSIVE METABOLIC PANEL WITH GFR
ALT: 18 U/L (ref 0–35)
AST: 15 U/L (ref 0–37)
Albumin: 4.2 g/dL (ref 3.5–5.2)
Alkaline Phosphatase: 78 U/L (ref 39–117)
BUN: 14 mg/dL (ref 6–23)
CO2: 32 meq/L (ref 19–32)
Calcium: 9.2 mg/dL (ref 8.4–10.5)
Chloride: 102 meq/L (ref 96–112)
Creatinine, Ser: 0.6 mg/dL (ref 0.40–1.20)
GFR: 99.27 mL/min (ref >60.00)
Glucose, Bld: 86 mg/dL (ref 70–99)
Potassium: 4.2 meq/L (ref 3.5–5.1)
Sodium: 141 meq/L (ref 135–145)
Total Bilirubin: 0.7 mg/dL (ref 0.2–1.2)
Total Protein: 7 g/dL (ref 6.0–8.3)

## 2024-06-29 LAB — CBC WITH DIFFERENTIAL/PLATELET
Basophils Absolute: 0 K/uL (ref 0.0–0.1)
Basophils Relative: 0.4 % (ref 0.0–3.0)
Eosinophils Absolute: 0.1 K/uL (ref 0.0–0.7)
Eosinophils Relative: 1.6 % (ref 0.0–5.0)
HCT: 38.9 % (ref 36.0–46.0)
Hemoglobin: 12.8 g/dL (ref 12.0–15.0)
Lymphocytes Relative: 18.6 % (ref 12.0–46.0)
Lymphs Abs: 1.1 K/uL (ref 0.7–4.0)
MCHC: 32.8 g/dL (ref 30.0–36.0)
MCV: 99.5 fl (ref 78.0–100.0)
Monocytes Absolute: 0.6 K/uL (ref 0.1–1.0)
Monocytes Relative: 10 % (ref 3.0–12.0)
Neutro Abs: 4 K/uL (ref 1.4–7.7)
Neutrophils Relative %: 69.4 % (ref 43.0–77.0)
Platelets: 271 K/uL (ref 150.0–400.0)
RBC: 3.91 Mil/uL (ref 3.87–5.11)
RDW: 13.9 % (ref 11.5–15.5)
WBC: 5.7 K/uL (ref 4.0–10.5)

## 2024-06-29 LAB — VITAMIN B12: Vitamin B-12: 547 pg/mL (ref 211–911)

## 2024-06-29 LAB — HEMOGLOBIN A1C: Hgb A1c MFr Bld: 5.8 % (ref 4.6–6.5)

## 2024-06-29 LAB — TSH: TSH: 0.7 u[IU]/mL (ref 0.35–5.50)

## 2024-06-29 LAB — VITAMIN D 25 HYDROXY (VIT D DEFICIENCY, FRACTURES): VITD: 17.52 ng/mL — ABNORMAL LOW (ref 30.00–100.00)

## 2024-06-29 NOTE — Assessment & Plan Note (Signed)
A1c added to labs disc imp of low glycemic diet and wt loss to prevent DM2
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
## 2024-06-29 NOTE — Assessment & Plan Note (Signed)
 Likely multi factorial  Reassuring exam  Lab today

## 2024-06-29 NOTE — Patient Instructions (Addendum)
 Labs today    Take care of yourself  Goal- is to feel better so you can exercise   Keep up the good work with weight loss Continue to avoid calorie beverages Try to get most of your carbohydrates from produce (with the exception of white potatoes) and whole grains Eat less bread/pasta/rice/snack foods/cereals/sweets and other items from the middle of the grocery store (processed carbs)   You have an order for:  [x]   3D Mammogram  []   Bone Density     Please call for appointment:   [x]   [facility]  [address] [place] [address]  [phone]  []   [facility])   [address]  [place], [place] [address]  [phone]  []   [facility]      [address] [place], [place]        [phone]         []   [facility]  [address] [place], [place]  [phone]  []  [facility]   [address]   [place], [place] [address]  [phone]  []  [facility]  [address] [place], [place] [address] [phone]    Make sure to wear two piece clothing  No lotions powders or deodorants the day of the appointment Make sure to bring picture ID and insurance card.  Bring list of medications you are currently taking including any supplements.   Schedule your screening mammogram through MyChart!   Select [place] imaging sites can now be scheduled through MyChart.  Log into your MyChart account.  Go to 'Visit' (or 'Appointments' if  on mobile App) --> Schedule an  Appointment  Under 'Select a Reason for Visit' choose the Mammogram  Screening option.  Complete the pre-visit questions  and select the time and place that  best fits your schedule

## 2024-06-29 NOTE — Assessment & Plan Note (Signed)
 In setting of wpw -seeing cardiology Episodes of light headedness  Labs to r/o other causes

## 2024-06-29 NOTE — Assessment & Plan Note (Signed)
 Discussed how this problem influences overall health and the risks it imposes  Reviewed plan for weight loss with lower calorie diet (via better food choices (lower glycemic and portion control) along with exercise building up to or more than 30 minutes 5 days per week including some aerobic activity and strength training   Encouraged to keep up the good work with weight loss

## 2024-06-29 NOTE — Assessment & Plan Note (Signed)
 Continues cardiology follow up  Continues to have episodic light headedness and palpitations

## 2024-06-29 NOTE — Assessment & Plan Note (Signed)
 Omeprazole  20 mg daily  Lab today  Weight loss encouraged

## 2024-06-29 NOTE — Assessment & Plan Note (Signed)
 B12 and D added to labs

## 2024-06-29 NOTE — Assessment & Plan Note (Signed)
Disc goals for lipids and reasons to control them Rev last labs with pt Rev low sat fat diet in detail  Lab today

## 2024-06-29 NOTE — Assessment & Plan Note (Signed)
 Mammogram ordered

## 2024-06-29 NOTE — Assessment & Plan Note (Signed)
D level today Discussed importance to bone and overall health

## 2024-06-30 ENCOUNTER — Encounter: Payer: Self-pay | Admitting: Family Medicine

## 2024-08-09 ENCOUNTER — Telehealth: Payer: Self-pay | Admitting: Family Medicine

## 2024-08-09 NOTE — Telephone Encounter (Unsigned)
 Copied from CRM [id number]. Topic: Medical Record Request - Records Request >> Aug 09, 2024  9:05 AM Gennette ORN wrote: Reason for CRM: Joen from [facility] [employer] [phone] ext [phone] is requesting an update on medical records that was sent on Apr 23, 2024. They have court next week.

## 2024-10-08 ENCOUNTER — Ambulatory Visit: Admitting: Family Medicine

## 2024-10-11 ENCOUNTER — Ambulatory Visit
Admission: RE | Admit: 2024-10-11 | Discharge: 2024-10-11 | Disposition: A | Discharge status: Home / Self Care | Source: Ambulatory Visit | Attending: Family Medicine | Admitting: Family Medicine

## 2024-10-11 DIAGNOSIS — Z1231 Encounter for screening mammogram for malignant neoplasm of breast: Secondary | ICD-10-CM | POA: Diagnosis present

## 2024-11-11 NOTE — Progress Notes (Unsigned)
 Cardiology Office Note  Date:  11/13/2024   ID:  Briana Burnett, DOB 01/14/1966, MRN [id number]  PCP:  Randeen Laine LABOR, MD   Chief Complaint  Patient presents with   12 month follow up     Patient c/o a vibration in her chest in the evenings when lying in bed.     HPI:  Briana Burnett  Is a 58 year old woman with history of  palpitations /PVCs WPW diagnosis August 2024 obesity who presents for f/u of her shortness of breath, palpitations/tachycardia/PVCs, WPW  LOV 9/24  hospital in August 2024  Initially presenting to urgent care with referral to the emergency room for palpitations, new diagnosis WPW  Seen by EP, Dr. 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 Careful ablation was performed with RF adjacent although no effect on pre-excitation.  Given proximity to compact AVN RF ablation was not successful. She continues to have occasional flutters and has been monitoring on apple watch ECG, all showing NSR  Started on  flecainide and metoprolol .   Now off medications When getting upset will have more symptoms, palpitations  When laying down at night, feels quick vibration in chest, couple seconds, then goes away, can happen more than once, does not feel like palpitations Has had these rare vibration feeling in her chest when supine in bed since 2024  Occasional vertigo type symptoms, sometimes when riding in a car   Event monitor through [facility]: 1/25: no afib  Cardiac risk factors No diabetes Total chol 160 Non smoker  Labs reviewed: HBA1c : 5.8 Total chol 167, LDL 108  Plan to start exercise program, doing some light walking  EKG personally reviewed by myself on todays visit EKG Interpretation Date/Time:  Tuesday November 13 2024 08:43:43 EST Ventricular Rate:  66 PR Interval:  108 QRS Duration:  114 QT Interval:  416 QTC Calculation: 436 R Axis:   -41  Text Interpretation: Normal sinus rhythm  Wolff-Parkinson-White When compared with ECG of 22-Aug-2023 11:48, No significant change was found Confirmed by Perla Lye [phone]) on 11/13/2024 8:56:55 AM     PMH:   has a past medical history of Allergy, Anemia, Complication of anesthesia, Endometriosis, GERD (gastroesophageal reflux disease), Headache, Heart palpitations, Low blood pressure, not hypotension, PONV (postoperative nausea and vomiting), and Stress incontinence.  PSH:    Past Surgical History:  Procedure Laterality Date   ABDOMINAL HYSTERECTOMY     BREAST EXCISIONAL BIOPSY Right    neg    MRI  05/12/2000   brain was neg   PLANTAR FASCIA SURGERY Left    SHOULDER ARTHROSCOPY WITH ROTATOR CUFF REPAIR Right 04/03/2018   Procedure: Right SHOULDER ARTHROSCOPY WITH ROTATOR CUFF REPAIR, distal clavicle excision, decompression and biceps tenotomy;  Surgeon: Leora Lynwood SAUNDERS, MD;  Location: [facility] ORS;  Service: Orthopedics;  Laterality: Right;   TONSILLECTOMY     age 30    Current Outpatient Medications  Medication Sig Dispense Refill   cholecalciferol (VITAMIN D3) 25 MCG (1000 UNIT) tablet Take 4,000 Units by mouth daily.     cyanocobalamin  (VITAMIN B12) 1000 MCG tablet Take 1,000 mcg by mouth daily.     omeprazole  (PRILOSEC) 20 MG capsule TAKE 1 CAPSULE BY MOUTH EVERY DAY AS NEEDED 90 capsule 1   No current facility-administered medications for this visit.    Allergies:   Nsaids and Cortisone   Social History:  The patient  reports that she has never smoked. She has never used smokeless  tobacco. She reports that she does not drink alcohol and does not use drugs.   Family History:   family history includes Alcohol abuse in her father; Breast cancer (age of onset: 24) in her sister; Breast cancer (age of onset: 86) in her maternal aunt; Breast cancer (age of onset: 47) in her maternal aunt; COPD in her father and mother; Cancer in her maternal aunt and maternal aunt; Cancer (age of onset: 19) in her sister; Crohn's disease in  her maternal uncle; Heart disease in her father, maternal grandmother, and maternal uncle; Irritable bowel syndrome in her mother; Mental illness (age of onset: 55) in her father; Pulmonary embolism in her mother.   Review of Systems: Review of Systems  Constitutional: Negative.   Respiratory: Negative.    Cardiovascular:  Positive for palpitations.  Gastrointestinal: Negative.   Musculoskeletal: Negative.   Neurological: Negative.   Psychiatric/Behavioral: Negative.    All other systems reviewed and are negative.   PHYSICAL EXAM: VS:  BP 100/80 (BP Location: Left Arm, Patient Position: Sitting, Cuff Size: Large)   Pulse 66   Ht 5' 4 (1.626 m)   Wt 230 lb (104.3 kg)   SpO2 98%   BMI 39.48 kg/m  , BMI Body mass index is 39.48 kg/m. Constitutional:  oriented to person, place, and time. No distress.  HENT:  Head: Normocephalic and atraumatic.  Eyes:  no discharge. No scleral icterus.  Neck: Normal range of motion. Neck supple. No JVD present.  Cardiovascular: Normal rate, regular rhythm, normal heart sounds and intact distal pulses. Exam reveals no gallop and no friction rub. No edema No murmur heard. Pulmonary/Chest: Effort normal and breath sounds normal. No stridor. No respiratory distress.  no wheezes.  no rales.  no tenderness.  Abdominal: Soft.  no distension.  no tenderness.  Musculoskeletal: Normal range of motion.  no  tenderness or deformity.  Neurological:  normal muscle tone. Coordination normal. No atrophy Skin: Skin is warm and dry. No rash noted. not diaphoretic.  Psychiatric:  normal mood and affect. behavior is normal. Thought content normal.   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  Headaches Episodic, Takes IBP as needed  Cardiac risk factors Non-smoker, cholesterol reasonable on no medication, no diabetes   Orders Placed This Encounter  Procedures   EKG 12-Lead     Signed, Velinda Lunger, M.D., Ph.D. 11/13/2024  [facility] [place], [place] [phone]

## 2024-11-13 ENCOUNTER — Encounter: Payer: Self-pay | Admitting: Cardiovascular Disease

## 2024-11-13 ENCOUNTER — Ambulatory Visit: Attending: Cardiovascular Disease | Admitting: Cardiovascular Disease

## 2024-11-13 VITALS — BP 100/80 | HR 66 | Ht 64.0 in | Wt 230.0 lb

## 2024-11-13 DIAGNOSIS — R002 Palpitations: Secondary | ICD-10-CM | POA: Diagnosis not present

## 2024-11-13 DIAGNOSIS — I493 Ventricular premature depolarization: Secondary | ICD-10-CM

## 2024-11-13 DIAGNOSIS — I456 Pre-excitation syndrome: Secondary | ICD-10-CM | POA: Diagnosis not present

## 2024-11-13 DIAGNOSIS — R7309 Other abnormal glucose: Secondary | ICD-10-CM

## 2024-11-13 DIAGNOSIS — E785 Hyperlipidemia, unspecified: Secondary | ICD-10-CM

## 2024-11-13 NOTE — Patient Instructions (Signed)

## 2024-11-27 NOTE — Telephone Encounter (Signed)
 Records sent by HIM, completed

## 2024-12-10 ENCOUNTER — Ambulatory Visit
Admission: EM | Admit: 2024-12-10 | Discharge: 2024-12-10 | Disposition: A | Discharge status: Home / Self Care | Attending: Family Medicine | Admitting: Family Medicine

## 2024-12-10 DIAGNOSIS — J069 Acute upper respiratory infection, unspecified: Secondary | ICD-10-CM | POA: Diagnosis not present

## 2024-12-10 MED ORDER — PROMETHAZINE-DM 6.25-15 MG/5ML PO SYRP: 5.0000 mL | ORAL_SOLUTION | Freq: Four times a day (QID) | ORAL | 0 refills | Status: AC | PRN

## 2024-12-10 MED ORDER — IPRATROPIUM BROMIDE 0.06 % NA SOLN: 2.0000 | Freq: Four times a day (QID) | NASAL | 12 refills | Status: AC

## 2024-12-10 MED ORDER — LEVALBUTEROL TARTRATE 45 MCG/ACT IN AERO: 1.0000 | INHALATION_SPRAY | Freq: Four times a day (QID) | RESPIRATORY_TRACT | 12 refills | Status: AC | PRN

## 2024-12-10 MED ORDER — BENZONATATE 100 MG PO CAPS: 200.0000 mg | ORAL_CAPSULE | Freq: Three times a day (TID) | ORAL | 0 refills | Status: AC

## 2024-12-10 MED ORDER — AMOXICILLIN-POT CLAVULANATE 875-125 MG PO TABS: 1.0000 | ORAL_TABLET | Freq: Two times a day (BID) | ORAL | 0 refills | Status: AC

## 2024-12-10 NOTE — ED Triage Notes (Signed)
 Sx x 1 week  Cough Sinus pressure Headache- took IBU today  SOB

## 2024-12-10 NOTE — ED Provider Notes (Signed)
 " [facility]    CSN: [id number] Arrival date & time: 12/10/24  1523      History   Chief Complaint Chief Complaint  Patient presents with   Cough   Headache    HPI Briana Burnett is a 59 y.o. female.   HPI  59 year old female with past medical history significant for mild hyperlipidemia, GERD, and WPW presents for evaluation of 1 week with respiratory symptoms to include headache, sinus pressure with yellow nasal discharge, sneezing, productive cough of yellow sputum, shortness breath, and wheezing.  No fever.  She did use her albuterol  inhaler last night for her shortness breath and wheezing which did help.  Past Medical History:  Diagnosis Date   Allergy    Anemia    h/o   Complication of anesthesia    Endometriosis    GERD (gastroesophageal reflux disease)    Headache    Heart palpitations    has seen dr perla   Low blood pressure, not hypotension    PONV (postoperative nausea and vomiting)    nasuea   Stress incontinence     Patient Active Problem List   Diagnosis Date Noted   Snoring 09/19/2023   Somnolence 09/19/2023   WPW (Wolff-Parkinson-White syndrome) 07/29/2023   Umbilical hernia 07/29/2023   Vitamin D  deficiency 06/24/2023   Current use of proton pump inhibitor 04/23/2022   GERD (gastroesophageal reflux disease) 04/07/2022   Right flank pain 04/07/2022   Elevated MCV 02/19/2020   Mild hyperlipidemia 02/19/2020   Elevated glucose level 02/11/2020   Encounter for screening mammogram for breast cancer 01/22/2019   Chronic right shoulder pain 01/22/2019   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   Epigastric pain 08/22/2017   Mood change 05/21/2015   Morbid obesity (HCC) 05/21/2015   Nocturia 07/24/2013   Allergic rhinitis 03/13/2012   Routine general medical examination at a health care facility 11/25/2011   New daily persistent headache 12/18/2009   Fatigue 02/04/2009   Palpitations 08/16/2007    Past Surgical History:  Procedure Laterality  Date   ABDOMINAL HYSTERECTOMY     BREAST EXCISIONAL BIOPSY Right    neg    MRI  05/12/2000   brain was neg   PLANTAR FASCIA SURGERY Left    SHOULDER ARTHROSCOPY WITH ROTATOR CUFF REPAIR Right 04/03/2018   Procedure: Right SHOULDER ARTHROSCOPY WITH ROTATOR CUFF REPAIR, distal clavicle excision, decompression and biceps tenotomy;  Surgeon: Leora Lynwood SAUNDERS, MD;  Location: [facility];  Service: Orthopedics;  Laterality: Right;   TONSILLECTOMY     age 15    OB History   No obstetric history on file.      Home Medications    Prior to Admission medications  Medication Sig Start Date End Date Taking? Authorizing Provider  amoxicillin -clavulanate (AUGMENTIN ) 875-125 MG tablet Take 1 tablet by mouth every 12 (twelve) hours for 7 days. 12/10/24 12/17/24 Yes Bernardino Ditch, NP  benzonatate  (TESSALON ) 100 MG capsule Take 2 capsules (200 mg total) by mouth every 8 (eight) hours. 12/10/24  Yes Bernardino Ditch, NP  ipratropium (ATROVENT ) 0.06 % nasal spray Place 2 sprays into both nostrils 4 (four) times daily. 12/10/24  Yes Bernardino Ditch, NP  levalbuterol  (XOPENEX  HFA) 45 MCG/ACT inhaler Inhale 1-2 puffs into the lungs every 6 (six) hours as needed for wheezing. 12/10/24  Yes Bernardino Ditch, NP  promethazine -dextromethorphan (PROMETHAZINE -DM) 6.25-15 MG/5ML syrup Take 5 mLs by mouth 4 (four) times daily as needed. 12/10/24  Yes Bernardino Ditch, NP  cholecalciferol (VITAMIN D3) 25 MCG (1000  UNIT) tablet Take 4,000 Units by mouth daily.    [provider]  cyanocobalamin  (VITAMIN B12) 1000 MCG tablet Take 1,000 mcg by mouth daily.    [provider]  omeprazole  (PRILOSEC) 20 MG capsule TAKE 1 CAPSULE BY MOUTH EVERY DAY AS NEEDED 12/06/23   Tower, Laine LABOR, MD    Family History Family History  Problem Relation Age of Onset   COPD Mother    Pulmonary embolism Mother    Irritable bowel syndrome Mother    Cancer Sister 31       breast ca   Breast cancer Sister 82   Heart disease Father    Mental  illness Father 20       dementia   COPD Father    Alcohol abuse Father    Cancer Maternal Aunt        breast ca   Breast cancer Maternal Aunt 45   Heart disease Maternal Uncle    Crohn's disease Maternal Uncle    Heart disease Maternal Grandmother    Cancer Maternal Aunt        breast ca   Breast cancer Maternal Aunt 8    Social History Social History[1]   Allergies   Nsaids and Cortisone   Review of Systems Review of Systems  Constitutional:  Negative for fever.  HENT:  Positive for congestion and rhinorrhea. Negative for ear pain and sore throat.   Respiratory:  Positive for cough, shortness of breath and wheezing.      Physical Exam Triage Vital Signs ED Triage Vitals  Encounter Vitals Group     BP 12/10/24 1621 137/73     Girls Systolic BP Percentile --      Girls Diastolic BP Percentile --      Boys Systolic BP Percentile --      Boys Diastolic BP Percentile --      Pulse Rate 12/10/24 1621 64     Resp 12/10/24 1621 18     Temp 12/10/24 1621 98.5 F (36.9 C)     Temp Source 12/10/24 1621 Oral     SpO2 12/10/24 1621 96 %     Weight 12/10/24 1620 221 lb (100.2 kg)     Height --      Head Circumference --      Peak Flow --      Pain Score 12/10/24 1620 2     Pain Loc --      Pain Education --      Exclude from Growth Chart --    No data found.  Updated Vital Signs BP 137/73 (BP Location: Right Arm)   Pulse 64   Temp 98.5 F (36.9 C) (Oral)   Resp 18   Wt 221 lb (100.2 kg)   SpO2 96%   BMI 37.93 kg/m   Visual Acuity Right Eye Distance:   Left Eye Distance:   Bilateral Distance:    Right Eye Near:   Left Eye Near:    Bilateral Near:     Physical Exam Vitals and nursing note reviewed.  Constitutional:      Appearance: Normal appearance. She is not ill-appearing.  HENT:     Head: Normocephalic and atraumatic.     Right Ear: Tympanic membrane, ear canal and external ear normal. There is no impacted cerumen.     Left Ear: Tympanic  membrane, ear canal and external ear normal. There is no impacted cerumen.     Nose: Congestion and rhinorrhea present.  Comments: Nasal mucosa is edematous and erythematous with yellow discharge in both nares.    Mouth/Throat:     Mouth: Mucous membranes are moist.     Pharynx: Oropharynx is clear. Posterior oropharyngeal erythema present. No oropharyngeal exudate.     Comments: Erythema to the posterior oropharynx with yellow postnasal drip. Cardiovascular:     Rate and Rhythm: Normal rate and regular rhythm.     Pulses: Normal pulses.     Heart sounds: Normal heart sounds. No murmur heard.    No friction rub. No gallop.  Pulmonary:     Effort: Pulmonary effort is normal.     Breath sounds: Normal breath sounds. No wheezing, rhonchi or rales.  Musculoskeletal:     Cervical back: Normal range of motion and neck supple. No tenderness.  Lymphadenopathy:     Cervical: No cervical adenopathy.  Skin:    General: Skin is warm and dry.     Capillary Refill: Capillary refill takes less than 2 seconds.     Findings: No rash.  Neurological:     General: No focal deficit present.     Mental Status: She is alert and oriented to person, place, and time.      UC Treatments / Results  Labs (all labs ordered are listed, but only abnormal results are displayed) Labs Reviewed - No data to display  EKG   Radiology No results found.  Procedures Procedures (including critical care time)  Medications Ordered in UC Medications - No data to display  Initial Impression / Assessment and Plan / UC Course  I have reviewed the triage vital signs and the nursing notes.  Pertinent labs & imaging results that were available during my care of the patient were reviewed by me and considered in my medical decision making (see chart for details).   Patient is a nontoxic-appearing 59 year old female presenting for evaluation of 1 week worth of respiratory symptoms as outlined in HPI above.  She  reports that she came in because last night she woke up gasping for air and feeling short of breath.  She used previously prescribed albuterol  inhaler which did improve her breathing but it did make her heart race.  The heart racing lasted approximately an hour.  She is concerned because she has WPW.  She sees cardiology and has seen electrophysiology.  I advised her that we could trial Xopenex  to see if that causes less heart racing but she could still use it 4 times a day as needed for shortness of breath or wheezing.  Because she has had symptoms for a week I do feel a trial of antibiotics is warranted so I will start her on Augmentin  875 twice daily with food.  I will also prescribe Atrovent  nasal spray for nasal congestion and Tessalon  Perles and Promethazine  DM cough syrup for cough and congestion.   Final Clinical Impressions(s) / UC Diagnoses   Final diagnoses:  URI with cough and congestion     Discharge Instructions      Take the Augmentin  875 mg twice daily with food for 7 days for treatment of your URI.  Use the Xopenex  inhaler in place of the albuterol  and take 1 to 2 puffs every 6 hours as needed for shortness of breath or wheezing.  Use the Atrovent  nasal spray, 2 squirts in each nostril every 6 hours, as needed for runny nose and postnasal drip.  Use the Tessalon  Perles every 8 hours during the day.  Take them with a small  sip of water.  They may give you some numbness to the base of your tongue or a metallic taste in your mouth, this is normal.  Use the Promethazine  DM cough syrup at bedtime for cough and congestion.  It will make you drowsy so do not take it during the day.  Return for reevaluation or see your primary care provider for any new or worsening symptoms.      ED Prescriptions     Medication Sig Dispense Auth. Provider   amoxicillin -clavulanate (AUGMENTIN ) 875-125 MG tablet Take 1 tablet by mouth every 12 (twelve) hours for 7 days. 14 tablet Bernardino Ditch,  NP   benzonatate  (TESSALON ) 100 MG capsule Take 2 capsules (200 mg total) by mouth every 8 (eight) hours. 21 capsule Bernardino Ditch, NP   ipratropium (ATROVENT ) 0.06 % nasal spray Place 2 sprays into both nostrils 4 (four) times daily. 15 mL Bernardino Ditch, NP   promethazine -dextromethorphan (PROMETHAZINE -DM) 6.25-15 MG/5ML syrup Take 5 mLs by mouth 4 (four) times daily as needed. 118 mL Bernardino Ditch, NP   levalbuterol  (XOPENEX  HFA) 45 MCG/ACT inhaler Inhale 1-2 puffs into the lungs every 6 (six) hours as needed for wheezing. 1 each Bernardino Ditch, NP      PDMP not reviewed this encounter.    [1]  Social History Tobacco Use   Smoking status: Never   Smokeless tobacco: Never  Vaping Use   Vaping status: Never Used  Substance Use Topics   Alcohol use: No    Alcohol/week: 0.0 standard drinks of alcohol   Drug use: No     Bernardino Ditch, NP 12/10/24 1636  "

## 2024-12-10 NOTE — Discharge Instructions (Addendum)
 Take the Augmentin  875 mg twice daily with food for 7 days for treatment of your URI.  Use the Xopenex  inhaler in place of the albuterol  and take 1 to 2 puffs every 6 hours as needed for shortness of breath or wheezing.  Use the Atrovent  nasal spray, 2 squirts in each nostril every 6 hours, as needed for runny nose and postnasal drip.  Use the Tessalon  Perles every 8 hours during the day.  Take them with a small sip of water.  They may give you some numbness to the base of your tongue or a metallic taste in your mouth, this is normal.  Use the Promethazine  DM cough syrup at bedtime for cough and congestion.  It will make you drowsy so do not take it during the day.  Return for reevaluation or see your primary care provider for any new or worsening symptoms.
